# Patient Record
Sex: Female | Born: 1960
Health system: Southern US, Community
[De-identification: ages and names within clinical notes are randomized; demographics above are authoritative.]

## PROBLEM LIST (undated history)

## (undated) DIAGNOSIS — E119 Type 2 diabetes mellitus without complications: Secondary | ICD-10-CM

## (undated) DIAGNOSIS — E785 Hyperlipidemia, unspecified: Secondary | ICD-10-CM

## (undated) DIAGNOSIS — Z9109 Other allergy status, other than to drugs and biological substances: Secondary | ICD-10-CM

## (undated) DIAGNOSIS — I10 Essential (primary) hypertension: Secondary | ICD-10-CM

## (undated) DIAGNOSIS — D259 Leiomyoma of uterus, unspecified: Secondary | ICD-10-CM

## (undated) HISTORY — PX: CATARACT EXTRACTION: SUR2

## (undated) HISTORY — DX: Other allergy status, other than to drugs and biological substances: Z91.09

## (undated) HISTORY — PX: TONSILLECTOMY: SUR1361

## (undated) HISTORY — PX: TUBAL LIGATION: SHX77

---
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
## 2004-09-01 ENCOUNTER — Emergency Department: Payer: Self-pay | Admitting: Unknown Physician Specialty

## 2005-04-22 ENCOUNTER — Emergency Department: Payer: Self-pay | Admitting: Emergency Medicine

## 2005-04-22 ENCOUNTER — Other Ambulatory Visit: Payer: Self-pay

## 2006-06-02 ENCOUNTER — Emergency Department: Payer: Self-pay | Admitting: Emergency Medicine

## 2006-06-13 ENCOUNTER — Emergency Department: Payer: Self-pay | Admitting: Emergency Medicine

## 2006-12-13 ENCOUNTER — Emergency Department: Payer: Self-pay | Admitting: Emergency Medicine

## 2008-03-23 ENCOUNTER — Emergency Department: Payer: Self-pay | Admitting: Emergency Medicine

## 2008-06-11 ENCOUNTER — Ambulatory Visit: Payer: Self-pay | Admitting: Family Medicine

## 2009-09-26 ENCOUNTER — Emergency Department: Payer: Self-pay | Admitting: Emergency Medicine

## 2011-09-15 ENCOUNTER — Emergency Department: Payer: Self-pay | Admitting: *Deleted

## 2011-09-15 LAB — COMPREHENSIVE METABOLIC PANEL
Alkaline Phosphatase: 73 U/L (ref 50–136)
Anion Gap: 7 (ref 7–16)
BUN: 12 mg/dL (ref 7–18)
Bilirubin,Total: 0.2 mg/dL (ref 0.2–1.0)
Calcium, Total: 9 mg/dL (ref 8.5–10.1)
Co2: 25 mmol/L (ref 21–32)
Creatinine: 0.77 mg/dL (ref 0.60–1.30)
Glucose: 126 mg/dL — ABNORMAL HIGH (ref 65–99)
Osmolality: 279 (ref 275–301)
SGPT (ALT): 29 U/L
Sodium: 139 mmol/L (ref 136–145)
Total Protein: 8.3 g/dL — ABNORMAL HIGH (ref 6.4–8.2)

## 2011-09-15 LAB — CBC
HCT: 41.7 %
HGB: 13.7 g/dL
MCH: 28.5 pg
MCHC: 32.8 g/dL
MCV: 87 fL
Platelet: 240 x10 3/mm 3
RBC: 4.79 X10 6/mm 3
RDW: 13.7 %
WBC: 6.1 x10 3/mm 3

## 2011-09-15 LAB — TROPONIN I: Troponin-I: <0.02 ng/mL

## 2011-10-20 ENCOUNTER — Ambulatory Visit: Payer: Self-pay | Admitting: Family Medicine

## 2011-11-08 ENCOUNTER — Ambulatory Visit: Payer: Self-pay | Admitting: Family Medicine

## 2011-11-10 ENCOUNTER — Ambulatory Visit: Payer: Self-pay | Admitting: Family Medicine

## 2011-11-17 ENCOUNTER — Ambulatory Visit: Payer: Self-pay | Admitting: Family Medicine

## 2012-07-19 ENCOUNTER — Ambulatory Visit: Payer: Self-pay

## 2012-09-27 ENCOUNTER — Ambulatory Visit: Payer: Self-pay | Admitting: Ophthalmology

## 2012-09-27 LAB — HEMOGLOBIN: HGB: 13.8 g/dL (ref 12.0–16.0)

## 2012-10-08 ENCOUNTER — Ambulatory Visit: Payer: Self-pay | Admitting: Ophthalmology

## 2012-11-23 ENCOUNTER — Ambulatory Visit: Payer: Self-pay | Admitting: Ophthalmology

## 2013-01-17 ENCOUNTER — Ambulatory Visit: Payer: Self-pay | Admitting: Ophthalmology

## 2013-01-17 LAB — HEMOGLOBIN: HGB: 14.1 g/dL (ref 12.0–16.0)

## 2013-01-28 ENCOUNTER — Ambulatory Visit: Payer: Self-pay | Admitting: Ophthalmology

## 2013-02-13 ENCOUNTER — Ambulatory Visit: Payer: Self-pay | Admitting: Family Medicine

## 2013-12-09 ENCOUNTER — Ambulatory Visit: Payer: Self-pay | Admitting: Internal Medicine

## 2014-08-05 DIAGNOSIS — Z8619 Personal history of other infectious and parasitic diseases: Secondary | ICD-10-CM | POA: Insufficient documentation

## 2014-08-05 HISTORY — DX: Personal history of other infectious and parasitic diseases: Z86.19

## 2014-08-21 DIAGNOSIS — J302 Other seasonal allergic rhinitis: Secondary | ICD-10-CM | POA: Insufficient documentation

## 2014-08-22 NOTE — Op Note (Signed)
PATIENT NAME:  Patricia Sherman, TWADDELL MR#:  [id number] DATE OF BIRTH:  [birth year]  DATE OF PROCEDURE:  10/08/2012  PREOPERATIVE DIAGNOSIS: Cataract, left eye.   POSTOPERATIVE DIAGNOSIS: Cataract, left eye.   PROCEDURE PERFORMED: Extracapsular cataract extraction using phacoemulsification with placement of Alcon SN6CWS, 16.0 diopter posterior chamber lens, serial number [id number].   SURGEON: Loura Back. Dazani Norby, M.D.   ANESTHESIA: 4% lidocaine and 0.75% Marcaine 50-50 mixture with 10 units/mL of Hylenex added, given as a peribulbar.   ANESTHESIOLOGIST: Dr. Benjamine Mola.   COMPLICATIONS: None.   ESTIMATED BLOOD LOSS: Less than 1 mL.   DESCRIPTION OF PROCEDURE:  The patient was brought to the operating room and given a peribulbar block.  The patient was then prepped and draped in the usual fashion.  The vertical rectus muscles were imbricated using 5-0 silk sutures.  These sutures were then clamped to the sterile drapes as bridle sutures.  A limbal peritomy was performed extending two clock hours and hemostasis was obtained with cautery.  A partial thickness scleral groove was made at the surgical limbus and dissected anteriorly in a lamellar dissection using an Alcon crescent knife.  The anterior chamber was entered supero-temporally with a Superblade and through the lamellar dissection with a 2.6 mm keratome.  DisCoVisc was used to replace the aqueous and a continuous tear capsulorrhexis was carried out.  Hydrodissection and hydrodelineation were carried out with balanced salt and a 27 gauge canula.  The nucleus was rotated to confirm the effectiveness of the hydrodissection.  Phacoemulsification was carried out using a divide-and-conquer technique.  Total ultrasound time was 1 minute and 17 seconds with an average power of 25.5%. CDE 34.42.  Irrigation/aspiration was used to remove the residual cortex.  DisCoVisc was used to inflate the capsule and the internal incision was enlarged to 3 mm with the  crescent knife.  The intraocular lens was folded and inserted into the capsular bag using the AcrySert delivery system.  Irrigation/aspiration was used to remove the residual DisCoVisc.  Miostat was injected into the anterior chamber through the paracentesis track to inflate the anterior chamber and induce miosis.  Cefuroxime 0.1 mL was injected through the paracentesis track. The wound was checked for leaks and none were found. The conjunctiva was closed with cautery and the bridle sutures were removed.  Two drops of 0.3% Vigamox were placed on the eye.   An eye shield was placed on the eye.  The patient was discharged to the recovery room in good condition.   ____________________________ Loura Back Jeania Nater, MD sad:jm D: 10/08/2012 14:00:34 ET T: 10/08/2012 15:05:07 ET JOB#: [id number]  cc: Remo Lipps A. Jadwiga Faidley, MD, <Dictator> Martie Lee MD ELECTRONICALLY SIGNED 10/15/2012 13:33

## 2014-08-22 NOTE — Op Note (Signed)
PATIENT NAME:  Patricia Sherman, Patricia Sherman MR#:  [id number] DATE OF BIRTH:  09/03/1960  DATE OF SURGERY:  01/28/2013  PREOPERATIVE DIAGNOSIS: Cataract, right eye.   POSTOPERATIVE DIAGNOSIS:  Cataract, right eye.   PROCEDURE PERFORMED: Extracapsular cataract extraction using phacoemulsification with placement of an Alcon SN6CWS 16.0-diopter posterior chamber lens, serial [id number].   SURGEON: Loura Back. Jhon Mallozzi, M.D.   ANESTHESIA: 4% lidocaine and 0.75% Marcaine, a 50/50 mixture with 10 units/mL of Hylenex added given as a peribulbar.   ANESTHESIOLOGIST: Dr. Myra Gianotti.   COMPLICATIONS: None.   ESTIMATED BLOOD LOSS: Less than 1 mL.   DESCRIPTION OF PROCEDURE:  The patient was brought to the operating room and given a peribulbar block.  The patient was then prepped and draped in the usual fashion.  The vertical rectus muscles were imbricated using 5-0 silk sutures.  These sutures were then clamped to the sterile drapes as bridle sutures.  A limbal peritomy was performed extending two clock hours and hemostasis was obtained with cautery.  A partial thickness scleral groove was made at the surgical limbus and then dissected anteriorly in a lamellar dissection with using an Alcon crescent knife.  The anterior chamber was entered superonasally with a Superblade and through the lamellar dissection with a 2.6-mm keratome.  DisCoVisc was used to replace the aqueous and a continuous tear capsulorrhexis was carried out.  Hydrodissection and hydrodelineation were carried out with balanced salt and a 27 gauge canula.  The nucleus was rotated to confirm the effectiveness of the hydrodissection.  Phacoemulsification was carried out using a divide-and-conquer technique.  Total ultrasound time was 1 minutes and 22 seconds, with an average power of 23.8%, CDE 31.68.  Irrigation/aspiration was used to remove the residual cortex.  DisCoVisc was used to inflate the capsule and the internal wound was enlarged to 3 mm with  the crescent knife.  The intraocular lens was inserted into the capsular bag using the [employer].  Irrigation/aspiration was used to remove the residual DisCoVisc.  Miostat was injected into the anterior chamber through the paracentesis track to inflate the anterior chamber and induce miosis.  No suture was placed. An AcrySert delivery system was used; 0.1 mL of cefuroxime containing 1 mg of the drug was injected via the paracentesis tract to induce miosis.   The wound was checked for leaks and wound leakage was found.  A single 10-0 suture was placed across the incision, tied and the knot was rotated superiorly.  The conjunctiva was closed with cautery and the bridle sutures were removed.  Two drops of 0.3% Vigamox were placed on the eye.  An eye shield was placed on the eye.  The patient was discharged to the recovery room in good condition.    ____________________________ Loura Back Anabela Crayton, MD sad:dm D: 01/28/2013 13:15:17 ET T: 01/28/2013 14:28:18 ET JOB#: [id number]  cc: Remo Lipps A. Basilio Meadow, MD, <Dictator> Martie Lee MD ELECTRONICALLY SIGNED 02/04/2013 13:24

## 2015-06-09 ENCOUNTER — Encounter: Payer: Self-pay | Admitting: Physician Assistant

## 2015-06-09 ENCOUNTER — Ambulatory Visit: Payer: Self-pay | Admitting: Physician Assistant

## 2015-06-09 VITALS — BP 138/90 | HR 76 | Temp 97.7°F

## 2015-06-09 DIAGNOSIS — R300 Dysuria: Secondary | ICD-10-CM

## 2015-06-09 LAB — POCT URINALYSIS DIPSTICK
Bilirubin, UA: NEGATIVE
Glucose, UA: NEGATIVE
Ketones, UA: NEGATIVE
NITRITE UA: NEGATIVE
Protein, UA: NEGATIVE
RBC UA: NEGATIVE
SPEC GRAV UA: 1.025
UROBILINOGEN UA: 0.2
pH, UA: 5

## 2015-06-09 MED ORDER — CEFDINIR 300 MG PO CAPS
300.0000 mg | ORAL_CAPSULE | Freq: Two times a day (BID) | ORAL | Status: DC
Start: 2015-06-09 — End: 2016-02-09

## 2015-06-09 MED ORDER — FLUCONAZOLE 150 MG PO TABS
ORAL_TABLET | ORAL | Status: DC
Start: 2015-06-09 — End: 2016-02-09

## 2015-06-09 NOTE — Progress Notes (Signed)
S: C/o runny nose and congestion for 3 days, facial pain across forehead and cheeks, nose bleeds x 2, stopped after pinching nose for about a minute;  no fever, chills, cp/sob, v/d; mucus is green and thick,    O: PE: vitals wnl, nad, appears well, perrl eomi, normocephalic, face tender to palp at frontal and max sinus; tms dull, nasal mucosa red and swollen, throat injected, neck supple no lymph, lungs c t a, cv rrr, neuro intact,ua trace leuks  A:  Acute sinusitis, uti   P: omnicef 300mg  bid, diflucan, neosproin or vaseline to nares; drink fluids, continue regular meds , use otc meds of choice, return if not improving in 5 days, return earlier if worsening

## 2015-06-19 ENCOUNTER — Ambulatory Visit: Payer: Self-pay | Admitting: Physician Assistant

## 2015-07-01 ENCOUNTER — Encounter: Payer: Self-pay | Admitting: Physician Assistant

## 2015-07-01 ENCOUNTER — Ambulatory Visit: Payer: Self-pay | Admitting: Family

## 2015-07-01 VITALS — BP 110/84 | Temp 98.7°F

## 2015-07-01 DIAGNOSIS — J019 Acute sinusitis, unspecified: Secondary | ICD-10-CM

## 2015-07-01 NOTE — Progress Notes (Signed)
See note in paper chart by Heather Ratcliffe, PAC  

## 2015-07-13 DIAGNOSIS — M7662 Achilles tendinitis, left leg: Secondary | ICD-10-CM | POA: Diagnosis not present

## 2015-07-13 DIAGNOSIS — M7661 Achilles tendinitis, right leg: Secondary | ICD-10-CM | POA: Diagnosis not present

## 2015-07-22 ENCOUNTER — Encounter: Payer: Self-pay | Admitting: Physician Assistant

## 2015-07-22 ENCOUNTER — Ambulatory Visit: Payer: Self-pay | Admitting: Physician Assistant

## 2015-07-22 VITALS — BP 140/87 | Temp 98.0°F

## 2015-07-22 DIAGNOSIS — N390 Urinary tract infection, site not specified: Secondary | ICD-10-CM

## 2015-07-22 DIAGNOSIS — R509 Fever, unspecified: Secondary | ICD-10-CM

## 2015-07-22 DIAGNOSIS — R319 Hematuria, unspecified: Secondary | ICD-10-CM

## 2015-07-22 DIAGNOSIS — R300 Dysuria: Secondary | ICD-10-CM

## 2015-07-22 LAB — POCT URINALYSIS DIPSTICK
BILIRUBIN UA: NEGATIVE
GLUCOSE UA: NEGATIVE
KETONES UA: NEGATIVE
NITRITE UA: NEGATIVE
Protein, UA: NEGATIVE
Spec Grav, UA: >=1.03
Urobilinogen, UA: 0.2
pH, UA: 6

## 2015-07-22 LAB — POCT INFLUENZA A/B
INFLUENZA B, POC: NEGATIVE
Influenza A, POC: NEGATIVE

## 2015-07-22 MED ORDER — CIPROFLOXACIN HCL 250 MG PO TABS: 250.0000 mg | ORAL_TABLET | Freq: Two times a day (BID) | ORAL | Status: AC

## 2015-07-22 NOTE — Progress Notes (Signed)
S: C/o runny nose and congestion for 3 days, sore throat and headache, sweating a lot but no fever, chills, cp/sob, v/d; also having a little suprapubic pain so thinks she has a uti  Using otc meds:   O: PE: vitals wnl, nad, perrl eomi, normocephalic, tms dull, nasal mucosa red and swollen, throat injected, neck supple no lymph, lungs c t a, cv rrr, neuro intact flu swab neg, ua 1+ leuks, trace blood  A:  Acute uti, pnd   P: cipro 250mg  bid x 7d; drink fluids, continue regular meds , use otc meds of choice, return if not improving in 5 days, return earlier if worsening

## 2015-08-28 ENCOUNTER — Encounter: Payer: Self-pay | Admitting: Physician Assistant

## 2015-08-28 ENCOUNTER — Ambulatory Visit: Payer: Self-pay | Admitting: Physician Assistant

## 2015-08-28 DIAGNOSIS — R300 Dysuria: Secondary | ICD-10-CM

## 2015-08-28 DIAGNOSIS — B349 Viral infection, unspecified: Secondary | ICD-10-CM

## 2015-08-28 LAB — POCT URINALYSIS DIPSTICK
BILIRUBIN UA: NEGATIVE
Glucose, UA: NEGATIVE
KETONES UA: NEGATIVE
Nitrite, UA: NEGATIVE
PH UA: 6
Protein, UA: NEGATIVE
RBC UA: NEGATIVE
Spec Grav, UA: 1.025
Urobilinogen, UA: 0.2

## 2015-08-28 NOTE — Progress Notes (Signed)
S: c/o just not feeling well, had diarrhea earlier this week, stomach still hurts a little, had fever/chills, no body aches, still getting sweaty every now and then, no v/, no uti sx, some nasal congestion; denies cp/sob/elevated glucose  O: vitals wnl, tms clear, nasal mucosa pink swollen, throat wnl, neck supple no lymph, lungs c t a, cv rrr, abd soft nontender, ua wnl  A: viral illness  P: increase fluids, recheck next week if not improving, go to ER if worsening over the weekend

## 2015-10-06 DIAGNOSIS — J302 Other seasonal allergic rhinitis: Secondary | ICD-10-CM | POA: Diagnosis not present

## 2015-10-06 DIAGNOSIS — E119 Type 2 diabetes mellitus without complications: Secondary | ICD-10-CM | POA: Diagnosis not present

## 2015-10-06 DIAGNOSIS — I10 Essential (primary) hypertension: Secondary | ICD-10-CM | POA: Diagnosis not present

## 2015-10-13 ENCOUNTER — Other Ambulatory Visit: Payer: Self-pay | Admitting: Internal Medicine

## 2015-10-13 DIAGNOSIS — J302 Other seasonal allergic rhinitis: Secondary | ICD-10-CM | POA: Diagnosis not present

## 2015-10-13 DIAGNOSIS — Z1231 Encounter for screening mammogram for malignant neoplasm of breast: Secondary | ICD-10-CM

## 2015-10-13 DIAGNOSIS — R5383 Other fatigue: Secondary | ICD-10-CM | POA: Diagnosis not present

## 2015-10-13 DIAGNOSIS — E119 Type 2 diabetes mellitus without complications: Secondary | ICD-10-CM | POA: Diagnosis not present

## 2015-10-20 ENCOUNTER — Ambulatory Visit: Payer: Self-pay | Attending: Internal Medicine

## 2015-10-29 DIAGNOSIS — R5382 Chronic fatigue, unspecified: Secondary | ICD-10-CM | POA: Diagnosis not present

## 2015-10-29 DIAGNOSIS — L739 Follicular disorder, unspecified: Secondary | ICD-10-CM | POA: Diagnosis not present

## 2015-10-29 DIAGNOSIS — R0789 Other chest pain: Secondary | ICD-10-CM | POA: Diagnosis not present

## 2015-10-29 DIAGNOSIS — R531 Weakness: Secondary | ICD-10-CM | POA: Diagnosis not present

## 2015-11-16 DIAGNOSIS — E669 Obesity, unspecified: Secondary | ICD-10-CM | POA: Diagnosis not present

## 2015-11-16 DIAGNOSIS — I208 Other forms of angina pectoris: Secondary | ICD-10-CM | POA: Diagnosis not present

## 2015-11-16 DIAGNOSIS — R0602 Shortness of breath: Secondary | ICD-10-CM | POA: Diagnosis not present

## 2015-11-16 DIAGNOSIS — E119 Type 2 diabetes mellitus without complications: Secondary | ICD-10-CM | POA: Diagnosis not present

## 2015-11-17 ENCOUNTER — Other Ambulatory Visit: Payer: Self-pay | Admitting: Internal Medicine

## 2015-11-17 DIAGNOSIS — R0602 Shortness of breath: Secondary | ICD-10-CM

## 2015-11-17 DIAGNOSIS — I2089 Other forms of angina pectoris: Secondary | ICD-10-CM

## 2015-11-17 DIAGNOSIS — I208 Other forms of angina pectoris: Secondary | ICD-10-CM

## 2015-11-25 ENCOUNTER — Emergency Department: Payer: 59

## 2015-11-25 ENCOUNTER — Encounter: Payer: Self-pay | Admitting: Emergency Medicine

## 2015-11-25 ENCOUNTER — Emergency Department
Admission: EM | Admit: 2015-11-25 | Discharge: 2015-11-25 | Disposition: A | Discharge status: Home / Self Care | Payer: 59 | Attending: Emergency Medicine | Admitting: Emergency Medicine

## 2015-11-25 DIAGNOSIS — R079 Chest pain, unspecified: Secondary | ICD-10-CM | POA: Diagnosis not present

## 2015-11-25 DIAGNOSIS — R0789 Other chest pain: Secondary | ICD-10-CM | POA: Diagnosis not present

## 2015-11-25 DIAGNOSIS — E119 Type 2 diabetes mellitus without complications: Secondary | ICD-10-CM | POA: Insufficient documentation

## 2015-11-25 DIAGNOSIS — M62838 Other muscle spasm: Secondary | ICD-10-CM

## 2015-11-25 DIAGNOSIS — Z79899 Other long term (current) drug therapy: Secondary | ICD-10-CM | POA: Insufficient documentation

## 2015-11-25 DIAGNOSIS — I10 Essential (primary) hypertension: Secondary | ICD-10-CM | POA: Diagnosis not present

## 2015-11-25 DIAGNOSIS — Z7984 Long term (current) use of oral hypoglycemic drugs: Secondary | ICD-10-CM | POA: Insufficient documentation

## 2015-11-25 HISTORY — DX: Essential (primary) hypertension: I10

## 2015-11-25 HISTORY — DX: Type 2 diabetes mellitus without complications: E11.9

## 2015-11-25 LAB — BASIC METABOLIC PANEL
ANION GAP: 7 (ref 5–15)
BUN: 15 mg/dL (ref 6–20)
CALCIUM: 9.4 mg/dL (ref 8.9–10.3)
CO2: 25 mmol/L (ref 22–32)
Chloride: 105 mmol/L (ref 101–111)
Creatinine, Ser: 0.76 mg/dL (ref 0.44–1.00)
Glucose, Bld: 157 mg/dL — ABNORMAL HIGH (ref 65–99)
POTASSIUM: 4.2 mmol/L (ref 3.5–5.1)
SODIUM: 137 mmol/L (ref 135–145)

## 2015-11-25 LAB — CBC
HEMATOCRIT: 42.9 % (ref 35.0–47.0)
HEMOGLOBIN: 14.5 g/dL (ref 12.0–16.0)
MCH: 28.4 pg (ref 26.0–34.0)
MCHC: 33.8 g/dL (ref 32.0–36.0)
MCV: 84 fL (ref 80.0–100.0)
Platelets: 237 10*3/uL (ref 150–440)
RBC: 5.11 MIL/uL (ref 3.80–5.20)
RDW: 13.3 % (ref 11.5–14.5)
WBC: 7.7 10*3/uL (ref 3.6–11.0)

## 2015-11-25 LAB — TROPONIN I

## 2015-11-25 MED ORDER — IBUPROFEN 800 MG PO TABS: 800.0000 mg | ORAL_TABLET | Freq: Three times a day (TID) | ORAL | 0 refills | Status: DC | PRN

## 2015-11-25 MED ORDER — CYCLOBENZAPRINE HCL 10 MG PO TABS: 10.0000 mg | ORAL_TABLET | Freq: Three times a day (TID) | ORAL | 0 refills | Status: DC | PRN

## 2015-11-25 MED ORDER — CYCLOBENZAPRINE HCL 10 MG PO TABS
10.0000 mg | ORAL_TABLET | Freq: Once | ORAL | Status: AC
  Administered 2015-11-25: 10 mg via ORAL
  Filled 2015-11-25: qty 1

## 2015-11-25 MED ORDER — IBUPROFEN 800 MG PO TABS
800.0000 mg | ORAL_TABLET | Freq: Once | ORAL | Status: AC
  Administered 2015-11-25: 800 mg via ORAL
  Filled 2015-11-25: qty 1

## 2015-11-25 NOTE — ED Notes (Signed)
Pt discharged to home.  Family member driving.  Discharge instructions reviewed.  Verbalized understanding.  No questions or concerns at this time.  Teach back verified.  Pt in NAD.  No items left in ED.   

## 2015-11-25 NOTE — ED Provider Notes (Signed)
[facility] Emergency Department Provider Note ____________________________________________  Time seen:   I have reviewed the triage vital signs and the triage nursing note.  HISTORY  Chief Complaint Chest Pain   Historian Patient  HPI Patricia Sherman is a 55 y.o. female here for evaluation of right neck/right shoulder and right breast and right back pain. Symptoms have been ongoing at least a week or so and are intermittent. She started seen her primary care physician and has an appointment with a cardiologist, Dr. Clayborn Bigness on Friday for likely stress testing.  For work she does lifttrays up and down although does not report a significant specific injury.  Symptoms are worse when she lifts and she feels it on the anterior aspect of her shoulder.  No trouble breathing, nausea, vomiting, sweats, weakness, confusion or altered mental status.    Past Medical History:  Diagnosis Date  . Diabetes mellitus without complication ([place])   . Hypertension     There are no active problems to display for this patient.   Past Surgical History:  Procedure Laterality Date  . TONSILLECTOMY    . TUBAL LIGATION      Prior to Admission medications   Medication Sig Start Date End Date Taking? Authorizing Provider  cefdinir (OMNICEF) 300 MG capsule Take 1 capsule (300 mg total) by mouth 2 (two) times daily. Patient not taking: Reported on 07/01/2015 06/09/15   Versie Starks, PA-C  cyclobenzaprine (FLEXERIL) 10 MG tablet Take 1 tablet (10 mg total) by mouth 3 (three) times daily as needed for muscle spasms. 11/25/15   Lisa Roca, MD  ferrous sulfate 325 (65 FE) MG tablet Take by mouth.    Historical Provider, MD  fluconazole (DIFLUCAN) 150 MG tablet Take one now and one in a week Patient not taking: Reported on 07/01/2015 06/09/15   Versie Starks, PA-C  glimepiride Jari Sportsman) 2 MG tablet  05/11/15   Historical Provider, MD  ibuprofen (ADVIL,MOTRIN) 800 MG tablet Take 1 tablet  (800 mg total) by mouth every 8 (eight) hours as needed. 11/25/15   Lisa Roca, MD  JANUVIA 100 MG tablet  05/11/15   Historical Provider, MD  meloxicam (MOBIC) 15 MG tablet Reported on 07/01/2015 03/12/15   Historical Provider, MD  valsartan (DIOVAN) 80 MG tablet  05/11/15   Historical Provider, MD    Allergies  Allergen Reactions  . Metformin Diarrhea    Other reaction(s): Other (See Comments) Elevated heart rate  . Sulfa Antibiotics Rash    No family history on file.  Social History Social History  Substance Use Topics  . Smoking status: Never Smoker  . Smokeless tobacco: Never Used  . Alcohol use No    Review of Systems  Constitutional: Negative for fever. Eyes: Negative for visual changes. ENT: Negative for sore throat. Cardiovascular: Negative for palpitations. Respiratory: Negative for shortness of breath. Gastrointestinal: Negative for abdominal pain, vomiting and diarrhea. Genitourinary: Negative for dysuria. Musculoskeletal: Negative for back pain. Skin: Negative for rash. Neurological: Negative for headache. 10 point Review of Systems otherwise negative ____________________________________________   PHYSICAL EXAM:  VITAL SIGNS: ED Triage Vitals  Enc Vitals Group     BP 11/25/15 1547 (!) 161/79     Pulse Rate 11/25/15 1547 92     Resp 11/25/15 1547 18     Temp 11/25/15 1547 97.5 F (36.4 C)     Temp Source 11/25/15 1547 Oral     SpO2 11/25/15 1547 96 %     Weight 11/25/15  1547 252 lb (114.3 kg)     Height 11/25/15 1547 5\' 1"  (1.549 m)     Head Circumference --      Peak Flow --      Pain Score 11/25/15 1548 7     Pain Loc --      Pain Edu? --      Excl. in [place]? --      Constitutional: Alert and oriented. Well appearing and in no distress. HEENT   Head: Normocephalic and atraumatic.      Eyes: Conjunctivae are normal. PERRL. Normal extraocular movements.      Ears:         Nose: No congestion/rhinnorhea.   Mouth/Throat: Mucous membranes  are moist.   Neck: No stridor.  Palpable muscle spasm along shoulder and neck area of trapezius which is tender to palpation. Cardiovascular/Chest: Normal rate, regular rhythm.  No murmurs, rubs, or gallops. Respiratory: Normal respiratory effort without tachypnea nor retractions. Breath sounds are clear and equal bilaterally. No wheezes/rales/rhonchi. Gastrointestinal: Soft. No distention, no guarding, no rebound. Nontender.   Genitourinary/rectal:Deferred Musculoskeletal: Right anterior shoulder pain along joint margin/muscle insertion sites. No joint effusions.  No lower extremity tenderness.  No edema. Neurologic:  Normal speech and language. No gross or focal neurologic deficits are appreciated. Skin:  Skin is warm, dry and intact. No rash noted. Psychiatric: Mood and affect are normal. Speech and behavior are normal. Patient exhibits appropriate insight and judgment.  ____________________________________________   EKG I, Lisa Roca, MD, the attending physician have personally viewed and interpreted all ECGs.  86 bpm. Normal sinus rhythm. Narrow QRS. Normal axis. Normal ST and T-wave. Q waves septally. ____________________________________________  LABS (pertinent positives/negatives)  Labs Reviewed  BASIC METABOLIC PANEL - Abnormal; Notable for the following:       Result Value   Glucose, Bld 157 (*)    All other components within normal limits  CBC  TROPONIN I    ____________________________________________  RADIOLOGY All Xrays were viewed by me. Imaging interpreted by Radiologist.  Chest two-view: Hyperinflation without acute cardiopulmonary disease. __________________________________________  PROCEDURES  Procedure(s) performed: None  Critical Care performed: None  ____________________________________________   ED COURSE / ASSESSMENT AND PLAN  Pertinent labs & imaging results that were available during my care of the patient were reviewed by me and  considered in my medical decision making (see chart for details).   Initially when she described her symptoms did seem somewhat musculoskeletal in nature in terms of it being worse with movements, and ranging from the right neck to the right arm to the right chest and sometimes into the back. There was no GI symptoms. Low suspicion for ACS and EKG and laboratory studies reassuring.  On physical exam though she has a very palpable muscle spasm in the right trapezius which I think is responsible for her discomfort.  I still think based on her age and history of atypical chest discomfort she should follow up with the cardiologist on Friday. But for now, I am prescribing ibuprofen and Flexeril and massage and stretching and if this does not alleviate her symptoms, she may need physical therapy for further management musculoskeletal pain.    CONSULTATIONS:   None   Patient / Family / Caregiver informed of clinical course, medical decision-making process, and agree with plan.   I discussed return precautions, follow-up instructions, and discharged instructions with patient and/or family.   ___________________________________________   FINAL CLINICAL IMPRESSION(S) / ED DIAGNOSES   Final diagnoses:  Nonspecific chest  pain  Trapezius muscle spasm              Note: This dictation was prepared with Dragon dictation. Any transcriptional errors that result from this process are unintentional    Lisa Roca, MD 11/25/15 1906

## 2015-11-25 NOTE — ED Triage Notes (Signed)
Pt presents to ED with reports of chest pain that began last night. Pt reports pain radiates to right arm and neck. Pt reports pain under breasts bilaterally. Pt denies SOB, nausea, vomiting or dizziness.

## 2015-11-25 NOTE — Discharge Instructions (Signed)
Although no certain cause was found for your symptoms, your exam and evaluation are reassuring in the Emergency Department today.  As we discussed, I suspect your symptoms are coming from muscle spasm. You may use stretching and heat and massage/pressure to help alleviate the muscle spasm. You may also discuss with her primary care physician about possible physical therapy or massage to help with the pain.  Certainly, continue your follow-up appointment on Friday with your cardiologist. Return to the Emergency Department for any new or worsening symptoms including trouble breathing, shortness of breath, palpitations, worsening pain, weakness or numbness, confusion or altered mental status, or any other symptoms concerning to you.

## 2015-11-26 ENCOUNTER — Ambulatory Visit: Payer: Self-pay

## 2015-11-27 ENCOUNTER — Ambulatory Visit
Admission: RE | Admit: 2015-11-27 | Discharge: 2015-11-27 | Disposition: A | Discharge status: Home / Self Care | Payer: 59 | Source: Ambulatory Visit | Attending: Internal Medicine | Admitting: Internal Medicine

## 2015-11-27 DIAGNOSIS — I208 Other forms of angina pectoris: Secondary | ICD-10-CM | POA: Insufficient documentation

## 2015-11-27 DIAGNOSIS — I209 Angina pectoris, unspecified: Secondary | ICD-10-CM | POA: Diagnosis not present

## 2015-11-27 DIAGNOSIS — R0602 Shortness of breath: Secondary | ICD-10-CM | POA: Diagnosis not present

## 2015-11-27 DIAGNOSIS — R079 Chest pain, unspecified: Secondary | ICD-10-CM | POA: Diagnosis not present

## 2015-11-27 MED ORDER — TECHNETIUM TC 99M TETROFOSMIN IV KIT
30.0000 | PACK | Freq: Once | INTRAVENOUS | Status: AC | PRN
  Administered 2015-11-27: 31.83 via INTRAVENOUS

## 2015-11-27 MED ORDER — REGADENOSON 0.4 MG/5ML IV SOLN
0.4000 mg | Freq: Once | INTRAVENOUS | Status: AC
  Administered 2015-11-27: 0.4 mg via INTRAVENOUS

## 2015-11-27 MED ORDER — TECHNETIUM TC 99M TETROFOSMIN IV KIT
12.9500 | PACK | Freq: Once | INTRAVENOUS | Status: AC | PRN
  Administered 2015-11-27: 12.95 via INTRAVENOUS

## 2015-11-27 NOTE — Progress Notes (Signed)
*  PRELIMINARY RESULTS* Echocardiogram 2D Echocardiogram has been performed.  Sherrie Sport 11/27/2015, 11:48 AM

## 2015-12-01 LAB — NM MYOCAR MULTI W/SPECT W/WALL MOTION / EF
CHL CUP NUCLEAR SDS: 16
CHL CUP RESTING HR STRESS: 106 {beats}/min
CHL CUP STRESS STAGE 2 GRADE: 0 %
CHL CUP STRESS STAGE 2 HR: 107 {beats}/min
CHL CUP STRESS STAGE 2 SPEED: 0 mph
CHL CUP STRESS STAGE 3 GRADE: 0 %
CHL CUP STRESS STAGE 5 GRADE: 0 %
CHL CUP STRESS STAGE 5 SPEED: 0 mph
CHL CUP STRESS STAGE 6 SBP: 145 mmHg
CHL CUP STRESS STAGE 6 SPEED: 0 mph
CSEPED: 1 min
CSEPEDS: 19 s
CSEPEW: 1 METS
CSEPPHR: 136 {beats}/min
CSEPPMHR: 82 %
LVDIAVOL: 95 mL (ref 46–106)
LVSYSVOL: 28 mL
NUC STRESS TID: 0.98
SRS: 5
SSS: 19
Stage 1 HR: 106 {beats}/min
Stage 3 HR: 107 {beats}/min
Stage 3 Speed: 0 mph
Stage 4 Grade: 0 %
Stage 4 HR: 136 {beats}/min
Stage 4 Speed: 0 mph
Stage 5 HR: 122 {beats}/min
Stage 6 DBP: 71 mmHg
Stage 6 Grade: 0 %
Stage 6 HR: 109 {beats}/min

## 2015-12-04 ENCOUNTER — Other Ambulatory Visit: Payer: Self-pay | Admitting: Internal Medicine

## 2015-12-04 ENCOUNTER — Ambulatory Visit
Admission: RE | Admit: 2015-12-04 | Discharge: 2015-12-04 | Disposition: A | Discharge status: Home / Self Care | Payer: 59 | Source: Ambulatory Visit | Attending: Internal Medicine | Admitting: Internal Medicine

## 2015-12-04 DIAGNOSIS — Z1231 Encounter for screening mammogram for malignant neoplasm of breast: Secondary | ICD-10-CM | POA: Insufficient documentation

## 2015-12-22 ENCOUNTER — Ambulatory Visit: Payer: Self-pay | Admitting: Physician Assistant

## 2016-01-01 ENCOUNTER — Ambulatory Visit: Payer: Self-pay | Admitting: Physician Assistant

## 2016-01-01 ENCOUNTER — Encounter: Payer: Self-pay | Admitting: Physician Assistant

## 2016-01-01 VITALS — BP 120/90 | HR 68 | Temp 98.6°F

## 2016-01-01 DIAGNOSIS — K120 Recurrent oral aphthae: Secondary | ICD-10-CM

## 2016-01-01 NOTE — Progress Notes (Signed)
   Subjective:    Patient ID: Patricia Sherman, female    DOB: 05-27-1960, 55 y.o.   MRN: [id number]  HPI Patient c/o oral ulcer lesion on lower inner lip and tongue for 2 days. States mild to moderate pain. No palliative measure for compliant. Also request Diflucan for vaginal yeast  compliant.   Review of Systems Diabetes and HTN.    Objective:   Physical Exam Oval ulcer lesion as above.       Assessment & Plan:Aphthous ulcer  Magic mouthwash and Diflucan. Follow up as needed.

## 2016-01-06 DIAGNOSIS — E119 Type 2 diabetes mellitus without complications: Secondary | ICD-10-CM | POA: Diagnosis not present

## 2016-01-08 ENCOUNTER — Ambulatory Visit: Payer: Self-pay | Admitting: Physician Assistant

## 2016-01-08 ENCOUNTER — Encounter: Payer: Self-pay | Admitting: Physician Assistant

## 2016-01-08 VITALS — BP 120/90 | Temp 98.1°F

## 2016-01-08 DIAGNOSIS — N898 Other specified noninflammatory disorders of vagina: Secondary | ICD-10-CM

## 2016-01-08 DIAGNOSIS — R300 Dysuria: Secondary | ICD-10-CM

## 2016-01-08 LAB — POCT URINALYSIS DIPSTICK
BILIRUBIN UA: NEGATIVE
Blood, UA: NEGATIVE
GLUCOSE UA: NEGATIVE
KETONES UA: NEGATIVE
LEUKOCYTES UA: NEGATIVE
Nitrite, UA: NEGATIVE
PH UA: 7
Spec Grav, UA: 1.02
Urobilinogen, UA: NEGATIVE

## 2016-01-08 NOTE — Progress Notes (Signed)
S: c/o some vaginal itchy, just "doesn't feel right down there" some discharge, no odor, took a diflucan, felt a little better but itching persists  O: vitals wnl, nad, ua wnl  A: vaginal itching  P: f/u with pcp or gyn for pelvic exam

## 2016-02-09 ENCOUNTER — Encounter: Payer: Self-pay | Admitting: Obstetrics and Gynecology

## 2016-02-09 ENCOUNTER — Ambulatory Visit (INDEPENDENT_AMBULATORY_CARE_PROVIDER_SITE_OTHER): Payer: 59 | Admitting: Obstetrics and Gynecology

## 2016-02-09 VITALS — BP 126/82 | HR 84 | Ht 62.0 in | Wt 253.4 lb

## 2016-02-09 DIAGNOSIS — E119 Type 2 diabetes mellitus without complications: Secondary | ICD-10-CM | POA: Diagnosis not present

## 2016-02-09 DIAGNOSIS — N951 Menopausal and female climacteric states: Secondary | ICD-10-CM | POA: Diagnosis not present

## 2016-02-09 DIAGNOSIS — E66813 Obesity, class 3: Secondary | ICD-10-CM

## 2016-02-09 DIAGNOSIS — D259 Leiomyoma of uterus, unspecified: Secondary | ICD-10-CM

## 2016-02-09 DIAGNOSIS — B379 Candidiasis, unspecified: Secondary | ICD-10-CM

## 2016-02-09 DIAGNOSIS — Z780 Asymptomatic menopausal state: Secondary | ICD-10-CM

## 2016-02-09 DIAGNOSIS — R102 Pelvic and perineal pain: Secondary | ICD-10-CM

## 2016-02-09 DIAGNOSIS — Z5320 Procedure and treatment not carried out because of patient's decision for unspecified reasons: Secondary | ICD-10-CM | POA: Insufficient documentation

## 2016-02-09 HISTORY — DX: Leiomyoma of uterus, unspecified: D25.9

## 2016-02-09 MED ORDER — NYSTATIN-TRIAMCINOLONE 100000-0.1 UNIT/GM-% EX CREA: 1.0000 "application " | TOPICAL_CREAM | Freq: Two times a day (BID) | CUTANEOUS | 1 refills | Status: DC

## 2016-02-09 NOTE — Addendum Note (Signed)
Addended by: Elouise Munroe on: 02/09/2016 10:26 AM   Modules accepted: Orders

## 2016-02-09 NOTE — Patient Instructions (Addendum)
1. Ultrasound is ordered 2. Information from ultrasound will be given through my chart. If follow-up is necessary, an appointment will be made 3. Nystatin/triamcinolone cream is to be applied topically to the skin rash on abdomen twice a day for 10 days

## 2016-02-09 NOTE — Progress Notes (Signed)
GYN ENCOUNTER NOTE  Subjective:       Patricia Sherman is a 55 y.o. [phone] female is here for gynecologic evaluation of the following issues:  1. Left lower quadrant pain 2. History of fibroids  No recent gynecologic evaluation. Patient reports intermittent left lower quadrant pain, made worse with walking, response to Advil. No reported change in bowel or bladder function. Bowel movements are daily. Not currently sexually active. No history of abnormal Pap smears..     Gynecologic History No LMP recorded. Patient is postmenopausal. Contraception: post menopausal status Last Pap: Unknown Last mammogram: Unknown  Obstetric History OB History  Gravida Para Term Preterm AB Living  5 2 2   3 2   SAB TAB Ectopic Multiple Live Births    0     2    # Outcome Date GA Lbr Len/2nd Weight Sex Delivery Anes PTL Lv  5 Term 1992   7 lb 6.4 oz (3.357 kg) F Vag-Spont   LIV  4 Term 1985   6 lb 11.2 oz (3.039 kg) F Vag-Spont   LIV  3 AB           2 AB           1 AB               Past Medical History:  Diagnosis Date  . Diabetes mellitus without complication ([place])   . Environmental allergies   . Hypertension     Past Surgical History:  Procedure Laterality Date  . CATARACT EXTRACTION    . TONSILLECTOMY    . TUBAL LIGATION      Current Outpatient Prescriptions on File Prior to Visit  Medication Sig Dispense Refill  . glimepiride (AMARYL) 2 MG tablet   3  . ibuprofen (ADVIL,MOTRIN) 800 MG tablet Take 1 tablet (800 mg total) by mouth every 8 (eight) hours as needed. 30 tablet 0  . JANUVIA 100 MG tablet   5  . valsartan (DIOVAN) 80 MG tablet   5   No current facility-administered medications on file prior to visit.     Allergies  Allergen Reactions  . Metformin Diarrhea    Other reaction(s): Other (See Comments) Elevated heart rate  . Sulfa Antibiotics Rash    Social History   Social History  . Marital status: Single    Spouse name: N/A  . Number of children: N/A  .  Years of education: N/A   Occupational History  . Not on file.   Social History Main Topics  . Smoking status: Never Smoker  . Smokeless tobacco: Never Used  . Alcohol use No  . Drug use: No  . Sexual activity: Not Currently   Other Topics Concern  . Not on file   Social History Narrative  . No narrative on file    Family History  Problem Relation Age of Onset  . Heart disease Mother   . Diabetes Sister   . Heart disease Brother   . Diabetes Daughter   . Breast cancer Neg Hx   . Ovarian cancer Neg Hx   . Colon cancer Neg Hx     The following portions of the patient's history were reviewed and updated as appropriate: allergies, current medications, past family history, past medical history, past social history, past surgical history and problem list.  Review of Systems Review of Systems -Per history of present illness Objective:   BP 126/82   Pulse 84   Ht 5\' 2"  (1.575 m)  Wt 253 lb 6.4 oz (114.9 kg)   BMI 46.35 kg/m  CONSTITUTIONAL: Well-developed, well-nourished female in no acute distress.  HENT:  Normocephalic, atraumatic.  NECK: Not examined SKIN: Skin is warm and dry. No rash noted. Not diaphoretic. No erythema. No pallor. [place]: Alert and oriented to person, place, and time. PSYCHIATRIC: Normal mood and affect. Normal behavior. Normal judgment and thought content. CARDIOVASCULAR:Not Examined RESPIRATORY: Not Examined BREASTS: Not Examined ABDOMEN: Soft, non distended; Non tender.  No Organomegaly.  Monilia type infection under abdominal pannus PELVIC:  External Genitalia: Normal  BUS: Normal  Vagina: Normal  Cervix: Normal; no cervical motion tenderness  Uterus: Mid plane, mobile, size difficult to assess because of body habitus  Adnexa: Normal; nonpalpable and nontender  RV: External hemorrhoids; normal sphincter tone; no rectal masses or tenderness  Bladder: Nontender MUSCULOSKELETAL: Normal range of motion. No tenderness.  No cyanosis,  clubbing, or edema.     Assessment:   1. Pelvic pain in female, nonacute  2. Uterine leiomyoma, unspecified location  3. Menopause present, declines hormone replacement therapy  4. Class 2 obesity due to excess calories with body mass index (BMI) of 35.0 to 35.9 in adult, unspecified whether serious comorbidity present  5. Vasomotor symptoms due to menopause  6. Type 2 diabetes mellitus without complication, without long-term current use of insulin (HCC)  7. Monilia dermatitis, abdominal pannus     Plan:   1. Pelvic ultrasound 2. Information will be given to patient when available 3. Nystatin/triamcinolone cream topically to yeast infection twice a day for 10 days 4. Return in 1 year for gynecologic exam  A total of 30 minutes were spent face-to-face with the patient during the encounter with greater than 50% dealing with counseling and coordination of care.  Brayton Mars, MD  Note: This dictation was prepared with Dragon dictation along with smaller phrase technology. Any transcriptional errors that result from this process are unintentional.

## 2016-02-19 ENCOUNTER — Ambulatory Visit (INDEPENDENT_AMBULATORY_CARE_PROVIDER_SITE_OTHER): Payer: 59

## 2016-02-19 DIAGNOSIS — D259 Leiomyoma of uterus, unspecified: Secondary | ICD-10-CM

## 2016-02-19 DIAGNOSIS — R102 Pelvic and perineal pain: Secondary | ICD-10-CM | POA: Diagnosis not present

## 2016-03-03 DIAGNOSIS — E119 Type 2 diabetes mellitus without complications: Secondary | ICD-10-CM | POA: Diagnosis not present

## 2016-03-03 DIAGNOSIS — I10 Essential (primary) hypertension: Secondary | ICD-10-CM | POA: Diagnosis not present

## 2016-03-10 DIAGNOSIS — I10 Essential (primary) hypertension: Secondary | ICD-10-CM | POA: Diagnosis not present

## 2016-03-10 DIAGNOSIS — M25511 Pain in right shoulder: Secondary | ICD-10-CM | POA: Diagnosis not present

## 2016-03-10 DIAGNOSIS — Z6841 Body Mass Index (BMI) 40.0 and over, adult: Secondary | ICD-10-CM | POA: Diagnosis not present

## 2016-03-10 DIAGNOSIS — E119 Type 2 diabetes mellitus without complications: Secondary | ICD-10-CM | POA: Diagnosis not present

## 2016-04-01 ENCOUNTER — Encounter: Payer: Self-pay | Admitting: Physician Assistant

## 2016-04-01 ENCOUNTER — Ambulatory Visit: Payer: Self-pay | Admitting: Physician Assistant

## 2016-04-01 VITALS — BP 130/80 | HR 72 | Temp 98.3°F

## 2016-04-01 DIAGNOSIS — J0100 Acute maxillary sinusitis, unspecified: Secondary | ICD-10-CM

## 2016-04-01 MED ORDER — FLUCONAZOLE 150 MG PO TABS: 150.0000 mg | ORAL_TABLET | Freq: Once | ORAL | 0 refills | Status: AC

## 2016-04-01 MED ORDER — AZITHROMYCIN 250 MG PO TABS: ORAL_TABLET | ORAL | 0 refills | Status: DC

## 2016-04-01 NOTE — Progress Notes (Signed)
S: C/o runny nose and congestion for 3 days, no fever, chills, cp/sob, v/d; mucus is green and thick, cough is sporadic, c/o of facial and dental pain.   Using otc meds:   O: PE: vitals wnl, nad, perrl eomi, normocephalic, tms dull, nasal mucosa red and swollen, throat injected, neck supple no lymph, lungs c t a, cv rrr, neuro intact  A:  Acute sinusitis   P: drink fluids, continue regular meds , use otc meds of choice, return if not improving in 5 days, return earlier if worsening , zpack

## 2016-06-29 ENCOUNTER — Ambulatory Visit: Payer: Self-pay | Admitting: Physician Assistant

## 2016-06-29 ENCOUNTER — Encounter: Payer: Self-pay | Admitting: Physician Assistant

## 2016-06-29 VITALS — BP 120/80 | HR 85 | Temp 97.7°F

## 2016-06-29 DIAGNOSIS — M545 Low back pain, unspecified: Secondary | ICD-10-CM

## 2016-06-29 DIAGNOSIS — E7800 Pure hypercholesterolemia, unspecified: Secondary | ICD-10-CM | POA: Insufficient documentation

## 2016-06-29 DIAGNOSIS — J301 Allergic rhinitis due to pollen: Secondary | ICD-10-CM

## 2016-06-29 LAB — POCT URINALYSIS DIPSTICK
Bilirubin, UA: NEGATIVE
Blood, UA: NEGATIVE
Glucose, UA: NEGATIVE
Ketones, UA: NEGATIVE
Leukocytes, UA: NEGATIVE
Nitrite, UA: NEGATIVE
PH UA: 5.5
PROTEIN UA: NEGATIVE
UROBILINOGEN UA: 0.2

## 2016-06-29 MED ORDER — FEXOFENADINE-PSEUDOEPHED ER 60-120 MG PO TB12: 1.0000 | ORAL_TABLET | Freq: Two times a day (BID) | ORAL | 0 refills | Status: DC

## 2016-06-29 MED ORDER — FLUTICASONE PROPIONATE 50 MCG/ACT NA SUSP: 2.0000 | Freq: Every day | NASAL | 6 refills | Status: DC

## 2016-06-29 MED ORDER — BENZONATATE 100 MG PO CAPS: 100.0000 mg | ORAL_CAPSULE | Freq: Three times a day (TID) | ORAL | 0 refills | Status: DC | PRN

## 2016-06-29 NOTE — Progress Notes (Addendum)
Pt called and is not better, ?if could get a zpack, sent rx to [facility]    Patient ID: Patricia Sherman, female    DOB: 1960-10-08, 56 y.o.   MRN: [id number]  HPI Patient c/o sneezing, cough, nasal congestion, watery eyes, and post nasal drainage for one week. Denies fever/chill, or N/V/D. Patient states allergra gives only transit relief.   Review of Systems     HTN, DM, and Hyperlipidema. Objective:   Physical Exam Bilateral erythematous conjunctiva, edematous nasal turbinates with clear rhinorrhea. Post nasl drainage. Neck supple, w/o adenopathy. Lung CTA and Heart RRR.       Assessment & Plan: Seasonal rhinitis.  Allergra-D, Flonase, and Tessalon.  Follow up PRN

## 2016-07-05 DIAGNOSIS — I10 Essential (primary) hypertension: Secondary | ICD-10-CM | POA: Diagnosis not present

## 2016-07-05 DIAGNOSIS — E119 Type 2 diabetes mellitus without complications: Secondary | ICD-10-CM | POA: Diagnosis not present

## 2016-07-05 DIAGNOSIS — Z6841 Body Mass Index (BMI) 40.0 and over, adult: Secondary | ICD-10-CM | POA: Diagnosis not present

## 2016-07-05 DIAGNOSIS — M25511 Pain in right shoulder: Secondary | ICD-10-CM | POA: Diagnosis not present

## 2016-07-07 DIAGNOSIS — E119 Type 2 diabetes mellitus without complications: Secondary | ICD-10-CM | POA: Diagnosis not present

## 2016-07-07 DIAGNOSIS — N898 Other specified noninflammatory disorders of vagina: Secondary | ICD-10-CM | POA: Diagnosis not present

## 2016-07-07 DIAGNOSIS — J301 Allergic rhinitis due to pollen: Secondary | ICD-10-CM | POA: Diagnosis not present

## 2016-07-07 DIAGNOSIS — I10 Essential (primary) hypertension: Secondary | ICD-10-CM | POA: Diagnosis not present

## 2016-07-11 ENCOUNTER — Telehealth: Payer: Self-pay | Admitting: Physician Assistant

## 2016-07-11 MED ORDER — AZITHROMYCIN 250 MG PO TABS: ORAL_TABLET | ORAL | 0 refills | Status: DC

## 2016-07-11 NOTE — Telephone Encounter (Signed)
Sent rx to [facility]

## 2016-07-11 NOTE — Addendum Note (Signed)
Addended by: Versie Starks on: 07/11/2016 10:25 AM   Modules accepted: Orders

## 2016-10-16 DIAGNOSIS — H0011 Chalazion right upper eyelid: Secondary | ICD-10-CM | POA: Diagnosis not present

## 2016-10-25 ENCOUNTER — Ambulatory Visit: Payer: Self-pay | Admitting: Physician Assistant

## 2016-10-31 DIAGNOSIS — E119 Type 2 diabetes mellitus without complications: Secondary | ICD-10-CM | POA: Diagnosis not present

## 2016-10-31 DIAGNOSIS — N898 Other specified noninflammatory disorders of vagina: Secondary | ICD-10-CM | POA: Diagnosis not present

## 2016-10-31 DIAGNOSIS — I10 Essential (primary) hypertension: Secondary | ICD-10-CM | POA: Diagnosis not present

## 2016-10-31 DIAGNOSIS — J301 Allergic rhinitis due to pollen: Secondary | ICD-10-CM | POA: Diagnosis not present

## 2016-11-07 DIAGNOSIS — I10 Essential (primary) hypertension: Secondary | ICD-10-CM | POA: Diagnosis not present

## 2016-11-07 DIAGNOSIS — J302 Other seasonal allergic rhinitis: Secondary | ICD-10-CM | POA: Diagnosis not present

## 2016-11-07 DIAGNOSIS — E119 Type 2 diabetes mellitus without complications: Secondary | ICD-10-CM | POA: Diagnosis not present

## 2016-11-07 DIAGNOSIS — L739 Follicular disorder, unspecified: Secondary | ICD-10-CM | POA: Diagnosis not present

## 2016-11-11 ENCOUNTER — Other Ambulatory Visit: Payer: Self-pay | Admitting: Internal Medicine

## 2016-11-11 DIAGNOSIS — Z1231 Encounter for screening mammogram for malignant neoplasm of breast: Secondary | ICD-10-CM

## 2017-01-03 ENCOUNTER — Ambulatory Visit
Admission: RE | Admit: 2017-01-03 | Discharge: 2017-01-03 | Disposition: A | Discharge status: Home / Self Care | Payer: 59 | Source: Ambulatory Visit | Attending: Internal Medicine | Admitting: Internal Medicine

## 2017-01-03 DIAGNOSIS — Z1231 Encounter for screening mammogram for malignant neoplasm of breast: Secondary | ICD-10-CM | POA: Diagnosis not present

## 2017-01-09 ENCOUNTER — Ambulatory Visit: Payer: Self-pay | Admitting: Family

## 2017-01-09 VITALS — BP 130/84 | HR 86 | Temp 98.7°F

## 2017-01-09 DIAGNOSIS — J302 Other seasonal allergic rhinitis: Secondary | ICD-10-CM

## 2017-01-09 MED ORDER — FLUCONAZOLE 150 MG PO TABS: ORAL_TABLET | ORAL | 0 refills | Status: DC

## 2017-01-09 MED ORDER — LEVOCETIRIZINE DIHYDROCHLORIDE 5 MG PO TABS: 5.0000 mg | ORAL_TABLET | Freq: Every evening | ORAL | 5 refills | Status: DC

## 2017-01-09 NOTE — Progress Notes (Signed)
S/: one-week history of nasal congestion , rhinorrhea, sneezing not relieved with herAllegra , her PCP called in a Z-Pack 3 days ago with slight improvement of symptoms  denies fever  ,chills , body aches or systemic symtoms  O/: vital signs stable pleasant no acute distress ENT left TM dull retracted right occluded with wax nasal mucosa swollen allergic appearing no facial tenderness, pharynx not fully visualized due to hyper active gag reflex but appears clear neck supple without nodes heart RSR lungs are clear   A/ : allergic rhinoinusitis    P/: Rx Xyzal. allergy tips reviewed and encouraged  Follow up when necessary not improving. Diflucan given for when necessary use

## 2017-02-03 ENCOUNTER — Ambulatory Visit: Payer: Self-pay | Admitting: Physician Assistant

## 2017-02-09 ENCOUNTER — Encounter: Payer: 59 | Admitting: Obstetrics and Gynecology

## 2017-02-16 DIAGNOSIS — E119 Type 2 diabetes mellitus without complications: Secondary | ICD-10-CM | POA: Diagnosis not present

## 2017-03-09 DIAGNOSIS — E119 Type 2 diabetes mellitus without complications: Secondary | ICD-10-CM | POA: Diagnosis not present

## 2017-03-09 DIAGNOSIS — L739 Follicular disorder, unspecified: Secondary | ICD-10-CM | POA: Diagnosis not present

## 2017-03-09 DIAGNOSIS — I10 Essential (primary) hypertension: Secondary | ICD-10-CM | POA: Diagnosis not present

## 2017-03-09 DIAGNOSIS — J302 Other seasonal allergic rhinitis: Secondary | ICD-10-CM | POA: Diagnosis not present

## 2017-03-15 ENCOUNTER — Ambulatory Visit (INDEPENDENT_AMBULATORY_CARE_PROVIDER_SITE_OTHER): Payer: 59 | Admitting: Obstetrics and Gynecology

## 2017-03-15 ENCOUNTER — Encounter: Payer: Self-pay | Admitting: Obstetrics and Gynecology

## 2017-03-15 VITALS — BP 116/78 | HR 89 | Ht 62.0 in | Wt 248.8 lb

## 2017-03-15 DIAGNOSIS — N812 Incomplete uterovaginal prolapse: Secondary | ICD-10-CM | POA: Diagnosis not present

## 2017-03-15 DIAGNOSIS — N951 Menopausal and female climacteric states: Secondary | ICD-10-CM | POA: Diagnosis not present

## 2017-03-15 DIAGNOSIS — Z01419 Encounter for gynecological examination (general) (routine) without abnormal findings: Secondary | ICD-10-CM

## 2017-03-15 DIAGNOSIS — Z5320 Procedure and treatment not carried out because of patient's decision for unspecified reasons: Secondary | ICD-10-CM | POA: Diagnosis not present

## 2017-03-15 DIAGNOSIS — Z780 Asymptomatic menopausal state: Secondary | ICD-10-CM

## 2017-03-15 DIAGNOSIS — D259 Leiomyoma of uterus, unspecified: Secondary | ICD-10-CM

## 2017-03-15 DIAGNOSIS — E66813 Obesity, class 3: Secondary | ICD-10-CM

## 2017-03-15 NOTE — Progress Notes (Signed)
ANNUAL PREVENTATIVE CARE GYN  ENCOUNTER NOTE  Subjective:       Patricia Sherman is a 56 y.o. [phone] female here for a routine annual gynecologic exam.  Current complaints: 1.  None  Bowel function is normal. Bladder function is normal. No major interval health issues have been identified. Patient is not currently exercising.   Gynecologic History No LMP recorded. Patient is postmenopausal. Contraception: post menopausal status Last Pap: unsure Results were: normal Last mammogram:12/2016 birad 1 . Results were: normal  Obstetric History OB History  Gravida Para Term Preterm AB Living  5 2 2   3 2   SAB TAB Ectopic Multiple Live Births    0     2    # Outcome Date GA Lbr Len/2nd Weight Sex Delivery Anes PTL Lv  5 Term 1992   7 lb 6.4 oz (3.357 kg) F Vag-Spont   LIV  4 Term 1985   6 lb 11.2 oz (3.039 kg) F Vag-Spont   LIV  3 AB           2 AB           1 AB               Past Medical History:  Diagnosis Date  . Diabetes mellitus without complication ([place])   . Environmental allergies   . Hypertension     Past Surgical History:  Procedure Laterality Date  . CATARACT EXTRACTION    . TONSILLECTOMY    . TUBAL LIGATION      Current Outpatient Medications on File Prior to Visit  Medication Sig Dispense Refill  . aspirin 81 MG chewable tablet Chew by mouth daily.    Marland Kitchen atorvastatin (LIPITOR) 10 MG tablet Take 10 mg daily by mouth.    . fexofenadine-pseudoephedrine (ALLEGRA-D) 60-120 MG 12 hr tablet Take 1 tablet by mouth 2 (two) times daily. 20 tablet 0  . glimepiride (AMARYL) 1 MG tablet Take by mouth.    Marland Kitchen JANUVIA 100 MG tablet   5  . losartan (COZAAR) 100 MG tablet Take by mouth.    Marland Kitchen glimepiride (AMARYL) 2 MG tablet   3   No current facility-administered medications on file prior to visit.     Allergies  Allergen Reactions  . Metformin Diarrhea    Other reaction(s): Other (See Comments) Elevated heart rate  . Sulfa Antibiotics Rash    Social History    Socioeconomic History  . Marital status: Single    Spouse name: Not on file  . Number of children: Not on file  . Years of education: Not on file  . Highest education level: Not on file  Social Needs  . Financial resource strain: Not on file  . Food insecurity - worry: Not on file  . Food insecurity - inability: Not on file  . Transportation needs - medical: Not on file  . Transportation needs - non-medical: Not on file  Occupational History  . Not on file  Tobacco Use  . Smoking status: Never Smoker  . Smokeless tobacco: Never Used  Substance and Sexual Activity  . Alcohol use: No    Alcohol/week: 0.0 oz  . Drug use: No  . Sexual activity: Not Currently  Other Topics Concern  . Not on file  Social History Narrative  . Not on file    Family History  Problem Relation Age of Onset  . Heart disease Mother   . Diabetes Sister   . Heart disease Brother   .  Diabetes Daughter   . Breast cancer Neg Hx   . Ovarian cancer Neg Hx   . Colon cancer Neg Hx     The following portions of the patient's history were reviewed and updated as appropriate: allergies, current medications, past family history, past medical history, past social history, past surgical history and problem list.  Review of Systems ROS Review of Systems - General ROS: negative for - chills, fatigue, fever, night sweats, weight gain or weight loss.  POSITIVE-hot flashes (not desiring HRT) Psychological ROS: negative for - anxiety, decreased libido, depression, mood swings, physical abuse or sexual abuse Ophthalmic ROS: negative for - blurry vision, eye pain or loss of vision ENT ROS: negative for - headaches, hearing change, visual changes or vocal changes Allergy and Immunology ROS: negative for - hives, itchy/watery eyes or seasonal allergies Hematological and Lymphatic ROS: negative for - bleeding problems, bruising, swollen lymph nodes or weight loss Endocrine ROS: negative for - galactorrhea, hair pattern  changes,malaise/lethargy, mood swings, palpitations, polydipsia/polyuria, skin changes, temperature intolerance or unexpected weight changes Breast ROS: negative for - new or changing breast lumps or nipple discharge Respiratory ROS: negative for - cough or shortness of breath Cardiovascular ROS: negative for - chest pain, irregular heartbeat, palpitations or shortness of breath Gastrointestinal ROS: no abdominal pain, change in bowel habits, or black or bloody stools Genito-Urinary ROS: no dysuria, trouble voiding, or hematuria Musculoskeletal ROS: negative for - joint pain or joint stiffness Neurological ROS: negative for - bowel and bladder control changes Dermatological ROS: negative for rash and skin lesion changes   Objective:   BP 116/78   Pulse 89   Ht 5\' 2"  (1.575 m)   Wt 248 lb 12.8 oz (112.9 kg)   BMI 45.51 kg/m  CONSTITUTIONAL: Well-developed, well-nourished female in no acute distress.  PSYCHIATRIC: Normal mood and affect. Normal behavior. Normal judgment and thought content. Berthold: Alert and oriented to person, place, and time. Normal muscle tone coordination. No cranial nerve deficit noted. HENT:  Normocephalic, atraumatic, External right and left ear normal. Oropharynx is clear and moist EYES: Conjunctivae and EOM are normal. No scleral icterus.  NECK: Normal range of motion, supple, no masses.  Normal thyroid.  SKIN: Skin is warm and dry. No rash noted. Not diaphoretic. No erythema. No pallor. CARDIOVASCULAR: Normal heart rate noted, regular rhythm, no murmur. RESPIRATORY: Clear to auscultation bilaterally. Effort and breath sounds normal, no problems with respiration noted. BREASTS: Symmetric in size. No masses, skin changes, nipple drainage, or lymphadenopathy. ABDOMEN: Soft, normal bowel sounds, no distention noted.  No tenderness, rebound or guarding.  BLADDER: Normal PELVIC: Graves speculum  External Genitalia: Normal  BUS: Normal  Vagina: Normal; good  estrogen effect; redundant vaginal sidewalls  Cervix: Normal; parous; prolapsed to within 2 cm of the introitus; no cervical motion tenderness  Uterus: Normal; midplane, normal size shape, mobile, nontender  Adnexa: Normal; nonpalpable nontender  RV: External Exam NormaI, No Rectal Masses and Normal Sphincter tone  MUSCULOSKELETAL: Normal range of motion. No tenderness.  No cyanosis, clubbing, or edema.  2+ distal pulses. LYMPHATIC: No Axillary, Supraclavicular, or Inguinal Adenopathy.    Assessment:   Annual gynecologic examination 56 y.o. Contraception: post menopausal status [id number] Problem List Items Addressed This Visit    Uterine leiomyoma   Menopause present, declines hormone replacement therapy   Obesity, Class III, BMI 40-49.9 (morbid obesity) ([place])   Vasomotor symptoms due to menopause    Other Visit Diagnoses    Well woman exam with routine gynecological  exam    -  Primary    Uterine prolapse, incomplete, asymptomatic  Plan:  Pap: Pap Co Test Mammogram: utd Stool Guaiac Testing:  needs colonscopy- will order thru pcp Labs: thru pcp Routine preventative health maintenance measures emphasized: Exercise/Diet/Weight control, Tobacco Warnings and Alcohol/Substance use risks Return to [place], CMA  Brayton Mars, MD  Note: This dictation was prepared with Dragon dictation along with smaller phrase technology. Any transcriptional errors that result from this process are unintentional.

## 2017-03-15 NOTE — Patient Instructions (Addendum)
1.  Pap smear is done 2.  Mammogram already obtained 3.  Colonoscopy is to be obtained this year through primary care 4.  Screening labs are to be obtained through primary care 5.  Continue with healthy eating, exercise, and control weight loss 6.  Calcium with vitamin D supplementation is recommended daily-1200 mg calcium; 800 international units of vitamin D. Sources of calcium:  Citracal  Os-Cal  Viactiv chews  Tums 7.  Return in 1 year for annual exam   Health Maintenance for Postmenopausal Women Menopause is a normal process in which your reproductive ability to an end. This process happens gradually over a span of months to years, usually between the ages of 22 and 75. Menopause is complete when you have missed 12 consecutive menstrual periods. It is important to talk with your health care provider about some of the most common conditions that affect postmenopausal women, such as heart disease, cancer, and bone loss (osteoporosis). Adopting a healthy lifestyle and getting preventive care can help to promote your health and wellness. Those actions can also lower your chances of developing some of these common conditions. What should I know about menopause? During menopause, you may experience a number of symptoms, such as:  Moderate-to-severe hot flashes.  Night sweats.  Decrease in sex drive.  Mood swings.  Headaches.  Tiredness.  Irritability.  Memory problems.  Insomnia.  Choosing to treat or not to treat menopausal changes is an individual decision that you make with your health care provider. What should I know about hormone replacement therapy and supplements? Hormone therapy products are effective for treating symptoms that are associated with menopause, such as hot flashes and night sweats. Hormone replacement carries certain risks, especially as you become older. If you are thinking about using estrogen or estrogen with progestin treatments, discuss the benefits  and risks with your health care provider. What should I know about heart disease and stroke? Heart disease, heart attack, and stroke become more likely as you age. This may be due, in part, to the hormonal changes that your body experiences during menopause. These can affect how your body processes dietary fats, triglycerides, and cholesterol. Heart attack and stroke are both medical emergencies. There are many things that you can do to help prevent heart disease and stroke:  Have your blood pressure checked at least every 1-2 years. High blood pressure causes heart disease and increases the risk of stroke.  If you are 58-61 years old, ask your health care provider if you should take aspirin to prevent a heart attack or a stroke.  Do not use any tobacco products, including cigarettes, chewing tobacco, or electronic cigarettes. If you need help quitting, ask your health care provider.  It is important to eat a healthy diet and maintain a healthy weight. ? Be sure to include plenty of vegetables, fruits, low-fat dairy products, and lean protein. ? Avoid eating foods that are high in solid fats, added sugars, or salt (sodium).  Get regular exercise. This is one of the most important things that you can do for your health. ? Try to exercise for at least 150 minutes each week. The type of exercise that you do should increase your heart rate and make you sweat. This is known as moderate-intensity exercise. ? Try to do strengthening exercises at least twice each week. Do these in addition to the moderate-intensity exercise.  Know your numbers.Ask your health care provider to check your cholesterol and your blood glucose. Continue to have  your blood tested as directed by your health care provider.  What should I know about cancer screening? There are several types of cancer. Take the following steps to reduce your risk and to catch any cancer development as early as possible. Breast  Cancer  Practice breast self-awareness. ? This means understanding how your breasts normally appear and feel. ? It also means doing regular breast self-exams. Let your health care provider know about any changes, no matter how small.  If you are 55 or older, have a clinician do a breast exam (clinical breast exam or CBE) every year. Depending on your age, family history, and medical history, it may be recommended that you also have a yearly breast X-ray (mammogram).  If you have a family history of breast cancer, talk with your health care provider about genetic screening.  If you are at high risk for breast cancer, talk with your health care provider about having an MRI and a mammogram every year.  Breast cancer (BRCA) gene test is recommended for women who have family members with BRCA-related cancers. Results of the assessment will determine the need for genetic counseling and BRCA1 and for BRCA2 testing. BRCA-related cancers include these types: ? Breast. This occurs in males or females. ? Ovarian. ? Tubal. This may also be called fallopian tube cancer. ? Cancer of the abdominal or pelvic lining (peritoneal cancer). ? Prostate. ? Pancreatic.  Cervical, Uterine, and Ovarian Cancer Your health care provider may recommend that you be screened regularly for cancer of the pelvic organs. These include your ovaries, uterus, and vagina. This screening involves a pelvic exam, which includes checking for microscopic changes to the surface of your cervix (Pap test).  For women ages 21-65, health care providers may recommend a pelvic exam and a Pap test every three years. For women ages 24-65, they may recommend the Pap test and pelvic exam, combined with testing for human papilloma virus (HPV), every five years. Some types of HPV increase your risk of cervical cancer. Testing for HPV may also be done on women of any age who have unclear Pap test results.  Other health care providers may not  recommend any screening for nonpregnant women who are considered low risk for pelvic cancer and have no symptoms. Ask your health care provider if a screening pelvic exam is right for you.  If you have had past treatment for cervical cancer or a condition that could lead to cancer, you need Pap tests and screening for cancer for at least 20 years after your treatment. If Pap tests have been discontinued for you, your risk factors (such as having a new sexual partner) need to be reassessed to determine if you should start having screenings again. Some women have medical problems that increase the chance of getting cervical cancer. In these cases, your health care provider may recommend that you have screening and Pap tests more often.  If you have a family history of uterine cancer or ovarian cancer, talk with your health care provider about genetic screening.  If you have vaginal bleeding after reaching menopause, tell your health care provider.  There are currently no reliable tests available to screen for ovarian cancer.  Lung Cancer Lung cancer screening is recommended for adults 68-56 years old who are at high risk for lung cancer because of a history of smoking. A yearly low-dose CT scan of the lungs is recommended if you:  Currently smoke.  Have a history of at least 30 pack-years of  smoking and you currently smoke or have quit within the past 15 years. A pack-year is smoking an average of one pack of cigarettes per day for one year.  Yearly screening should:  Continue until it has been 15 years since you quit.  Stop if you develop a health problem that would prevent you from having lung cancer treatment.  Colorectal Cancer  This type of cancer can be detected and can often be prevented.  Routine colorectal cancer screening usually begins at age 46 and continues through age 75.  If you have risk factors for colon cancer, your health care provider may recommend that you be screened  at an earlier age.  If you have a family history of colorectal cancer, talk with your health care provider about genetic screening.  Your health care provider may also recommend using home test kits to check for hidden blood in your stool.  A small camera at the end of a tube can be used to examine your colon directly (sigmoidoscopy or colonoscopy). This is done to check for the earliest forms of colorectal cancer.  Direct examination of the colon should be repeated every 5-10 years until age 74. However, if early forms of precancerous polyps or small growths are found or if you have a family history or genetic risk for colorectal cancer, you may need to be screened more often.  Skin Cancer  Check your skin from head to toe regularly.  Monitor any moles. Be sure to tell your health care provider: ? About any new moles or changes in moles, especially if there is a change in a mole's shape or color. ? If you have a mole that is larger than the size of a pencil eraser.  If any of your family members has a history of skin cancer, especially at a young age, talk with your health care provider about genetic screening.  Always use sunscreen. Apply sunscreen liberally and repeatedly throughout the day.  Whenever you are outside, protect yourself by wearing long sleeves, pants, a wide-brimmed hat, and sunglasses.  What should I know about osteoporosis? Osteoporosis is a condition in which bone destruction happens more quickly than new bone creation. After menopause, you may be at an increased risk for osteoporosis. To help prevent osteoporosis or the bone fractures that can happen because of osteoporosis, the following is recommended:  If you are 66-45 years old, get at least 1,000 mg of calcium and at least 600 mg of vitamin D per day.  If you are older than age 69 but younger than age 20, get at least 1,200 mg of calcium and at least 600 mg of vitamin D per day.  If you are older than age 70,  get at least 1,200 mg of calcium and at least 800 mg of vitamin D per day.  Smoking and excessive alcohol intake increase the risk of osteoporosis. Eat foods that are rich in calcium and vitamin D, and do weight-bearing exercises several times each week as directed by your health care provider. What should I know about how menopause affects my mental health? Depression may occur at any age, but it is more common as you become older. Common symptoms of depression include:  Low or sad mood.  Changes in sleep patterns.  Changes in appetite or eating patterns.  Feeling an overall lack of motivation or enjoyment of activities that you previously enjoyed.  Frequent crying spells.  Talk with your health care provider if you think that you are  experiencing depression. What should I know about immunizations? It is important that you get and maintain your immunizations. These include:  Tetanus, diphtheria, and pertussis (Tdap) booster vaccine.  Influenza every year before the flu season begins.  Pneumonia vaccine.  Shingles vaccine.  Your health care provider may also recommend other immunizations. This information is not intended to replace advice given to you by your health care provider. Make sure you discuss any questions you have with your health care provider. Document Released: 06/10/2005 Document Revised: 11/06/2015 Document Reviewed: 01/20/2015 Elsevier Interactive Patient Education  2018 [employer].

## 2017-03-16 DIAGNOSIS — I10 Essential (primary) hypertension: Secondary | ICD-10-CM | POA: Diagnosis not present

## 2017-03-16 DIAGNOSIS — Z6841 Body Mass Index (BMI) 40.0 and over, adult: Secondary | ICD-10-CM | POA: Diagnosis not present

## 2017-03-16 DIAGNOSIS — E119 Type 2 diabetes mellitus without complications: Secondary | ICD-10-CM | POA: Diagnosis not present

## 2017-03-16 DIAGNOSIS — Z0000 Encounter for general adult medical examination without abnormal findings: Secondary | ICD-10-CM | POA: Diagnosis not present

## 2017-03-17 LAB — IGP, COBASHPV16/18
HPV 16: NEGATIVE
HPV 18: NEGATIVE
HPV other hr types: NEGATIVE
PAP Smear Comment: 0

## 2017-05-09 DIAGNOSIS — M778 Other enthesopathies, not elsewhere classified: Secondary | ICD-10-CM | POA: Diagnosis not present

## 2017-05-31 LAB — HM HIV SCREENING LAB: HM HIV Screening: NEGATIVE

## 2017-06-21 DIAGNOSIS — J302 Other seasonal allergic rhinitis: Secondary | ICD-10-CM | POA: Diagnosis not present

## 2017-06-21 DIAGNOSIS — L02211 Cutaneous abscess of abdominal wall: Secondary | ICD-10-CM | POA: Diagnosis not present

## 2017-06-21 DIAGNOSIS — E119 Type 2 diabetes mellitus without complications: Secondary | ICD-10-CM | POA: Diagnosis not present

## 2017-09-13 DIAGNOSIS — Z6841 Body Mass Index (BMI) 40.0 and over, adult: Secondary | ICD-10-CM | POA: Diagnosis not present

## 2017-09-13 DIAGNOSIS — E119 Type 2 diabetes mellitus without complications: Secondary | ICD-10-CM | POA: Diagnosis not present

## 2017-09-13 DIAGNOSIS — I10 Essential (primary) hypertension: Secondary | ICD-10-CM | POA: Diagnosis not present

## 2017-09-13 DIAGNOSIS — R8290 Unspecified abnormal findings in urine: Secondary | ICD-10-CM | POA: Diagnosis not present

## 2017-09-13 DIAGNOSIS — Z0000 Encounter for general adult medical examination without abnormal findings: Secondary | ICD-10-CM | POA: Diagnosis not present

## 2017-09-20 DIAGNOSIS — I10 Essential (primary) hypertension: Secondary | ICD-10-CM | POA: Diagnosis not present

## 2017-09-20 DIAGNOSIS — E119 Type 2 diabetes mellitus without complications: Secondary | ICD-10-CM | POA: Diagnosis not present

## 2017-09-20 DIAGNOSIS — J302 Other seasonal allergic rhinitis: Secondary | ICD-10-CM | POA: Diagnosis not present

## 2017-09-20 DIAGNOSIS — Z6841 Body Mass Index (BMI) 40.0 and over, adult: Secondary | ICD-10-CM | POA: Diagnosis not present

## 2017-12-14 DIAGNOSIS — J302 Other seasonal allergic rhinitis: Secondary | ICD-10-CM | POA: Diagnosis not present

## 2017-12-14 DIAGNOSIS — Z6841 Body Mass Index (BMI) 40.0 and over, adult: Secondary | ICD-10-CM | POA: Diagnosis not present

## 2017-12-14 DIAGNOSIS — E119 Type 2 diabetes mellitus without complications: Secondary | ICD-10-CM | POA: Diagnosis not present

## 2017-12-14 DIAGNOSIS — I10 Essential (primary) hypertension: Secondary | ICD-10-CM | POA: Diagnosis not present

## 2017-12-21 DIAGNOSIS — I10 Essential (primary) hypertension: Secondary | ICD-10-CM | POA: Diagnosis not present

## 2017-12-21 DIAGNOSIS — E119 Type 2 diabetes mellitus without complications: Secondary | ICD-10-CM | POA: Diagnosis not present

## 2017-12-21 DIAGNOSIS — Z6841 Body Mass Index (BMI) 40.0 and over, adult: Secondary | ICD-10-CM | POA: Diagnosis not present

## 2017-12-21 DIAGNOSIS — J302 Other seasonal allergic rhinitis: Secondary | ICD-10-CM | POA: Diagnosis not present

## 2018-01-26 ENCOUNTER — Other Ambulatory Visit: Payer: Self-pay | Admitting: Internal Medicine

## 2018-01-26 DIAGNOSIS — Z1231 Encounter for screening mammogram for malignant neoplasm of breast: Secondary | ICD-10-CM

## 2018-04-06 DIAGNOSIS — Z113 Encounter for screening for infections with a predominantly sexual mode of transmission: Secondary | ICD-10-CM | POA: Diagnosis not present

## 2018-04-10 DIAGNOSIS — E119 Type 2 diabetes mellitus without complications: Secondary | ICD-10-CM | POA: Diagnosis not present

## 2018-04-26 DIAGNOSIS — E1165 Type 2 diabetes mellitus with hyperglycemia: Secondary | ICD-10-CM | POA: Diagnosis not present

## 2018-04-26 DIAGNOSIS — J302 Other seasonal allergic rhinitis: Secondary | ICD-10-CM | POA: Diagnosis not present

## 2018-04-26 DIAGNOSIS — J0111 Acute recurrent frontal sinusitis: Secondary | ICD-10-CM | POA: Diagnosis not present

## 2018-04-26 DIAGNOSIS — I10 Essential (primary) hypertension: Secondary | ICD-10-CM | POA: Diagnosis not present

## 2018-04-26 DIAGNOSIS — Z6841 Body Mass Index (BMI) 40.0 and over, adult: Secondary | ICD-10-CM | POA: Diagnosis not present

## 2018-04-26 DIAGNOSIS — R8290 Unspecified abnormal findings in urine: Secondary | ICD-10-CM | POA: Diagnosis not present

## 2018-04-26 DIAGNOSIS — E119 Type 2 diabetes mellitus without complications: Secondary | ICD-10-CM | POA: Diagnosis not present

## 2018-05-04 ENCOUNTER — Ambulatory Visit
Admission: RE | Admit: 2018-05-04 | Discharge: 2018-05-04 | Disposition: A | Discharge status: Home / Self Care | Payer: 59 | Source: Ambulatory Visit | Attending: Internal Medicine | Admitting: Internal Medicine

## 2018-05-04 DIAGNOSIS — Z1231 Encounter for screening mammogram for malignant neoplasm of breast: Secondary | ICD-10-CM | POA: Insufficient documentation

## 2018-05-24 DIAGNOSIS — I10 Essential (primary) hypertension: Secondary | ICD-10-CM | POA: Diagnosis not present

## 2018-05-24 DIAGNOSIS — Z1211 Encounter for screening for malignant neoplasm of colon: Secondary | ICD-10-CM | POA: Diagnosis not present

## 2018-05-24 DIAGNOSIS — Z8371 Family history of colonic polyps: Secondary | ICD-10-CM | POA: Diagnosis not present

## 2018-05-24 DIAGNOSIS — E119 Type 2 diabetes mellitus without complications: Secondary | ICD-10-CM | POA: Diagnosis not present

## 2018-07-11 DIAGNOSIS — R6889 Other general symptoms and signs: Secondary | ICD-10-CM | POA: Diagnosis not present

## 2018-07-11 DIAGNOSIS — R040 Epistaxis: Secondary | ICD-10-CM | POA: Diagnosis not present

## 2018-07-11 DIAGNOSIS — J101 Influenza due to other identified influenza virus with other respiratory manifestations: Secondary | ICD-10-CM | POA: Diagnosis not present

## 2018-07-20 ENCOUNTER — Encounter: Payer: 59 | Admitting: Obstetrics and Gynecology

## 2018-08-07 DIAGNOSIS — E119 Type 2 diabetes mellitus without complications: Secondary | ICD-10-CM | POA: Diagnosis not present

## 2018-08-07 DIAGNOSIS — R040 Epistaxis: Secondary | ICD-10-CM | POA: Diagnosis not present

## 2018-08-07 DIAGNOSIS — I10 Essential (primary) hypertension: Secondary | ICD-10-CM | POA: Diagnosis not present

## 2018-08-07 DIAGNOSIS — Z6841 Body Mass Index (BMI) 40.0 and over, adult: Secondary | ICD-10-CM | POA: Diagnosis not present

## 2018-08-09 ENCOUNTER — Encounter: Payer: 59 | Admitting: Obstetrics and Gynecology

## 2018-08-22 DIAGNOSIS — J0111 Acute recurrent frontal sinusitis: Secondary | ICD-10-CM | POA: Diagnosis not present

## 2018-08-22 DIAGNOSIS — Z6841 Body Mass Index (BMI) 40.0 and over, adult: Secondary | ICD-10-CM | POA: Diagnosis not present

## 2018-08-22 DIAGNOSIS — I10 Essential (primary) hypertension: Secondary | ICD-10-CM | POA: Diagnosis not present

## 2018-08-22 DIAGNOSIS — E1165 Type 2 diabetes mellitus with hyperglycemia: Secondary | ICD-10-CM | POA: Diagnosis not present

## 2018-08-22 DIAGNOSIS — Z0000 Encounter for general adult medical examination without abnormal findings: Secondary | ICD-10-CM | POA: Diagnosis not present

## 2018-08-27 DIAGNOSIS — I10 Essential (primary) hypertension: Secondary | ICD-10-CM | POA: Diagnosis not present

## 2018-08-27 DIAGNOSIS — E1165 Type 2 diabetes mellitus with hyperglycemia: Secondary | ICD-10-CM | POA: Diagnosis not present

## 2018-08-27 DIAGNOSIS — J302 Other seasonal allergic rhinitis: Secondary | ICD-10-CM | POA: Diagnosis not present

## 2018-08-27 DIAGNOSIS — Z6841 Body Mass Index (BMI) 40.0 and over, adult: Secondary | ICD-10-CM | POA: Diagnosis not present

## 2018-09-14 DIAGNOSIS — R040 Epistaxis: Secondary | ICD-10-CM | POA: Diagnosis not present

## 2018-11-01 ENCOUNTER — Encounter: Payer: 59 | Admitting: Obstetrics and Gynecology

## 2018-11-06 ENCOUNTER — Telehealth: Payer: Self-pay

## 2018-11-06 NOTE — Progress Notes (Signed)
Pt is present for annual exam. Pt stated that she has been doing self-breast exams monthly. Pt stated that she is doing well and denies any issues. No problems or concerns.

## 2018-11-06 NOTE — Telephone Encounter (Signed)
Pt called no answer LM to return call for prescreening.

## 2018-11-07 ENCOUNTER — Ambulatory Visit (INDEPENDENT_AMBULATORY_CARE_PROVIDER_SITE_OTHER): Payer: 59 | Admitting: Obstetrics and Gynecology

## 2018-11-07 ENCOUNTER — Encounter: Payer: Self-pay | Admitting: Obstetrics and Gynecology

## 2018-11-07 ENCOUNTER — Other Ambulatory Visit: Payer: Self-pay

## 2018-11-07 VITALS — BP 126/86 | HR 76 | Ht 62.0 in | Wt 244.8 lb

## 2018-11-07 DIAGNOSIS — I10 Essential (primary) hypertension: Secondary | ICD-10-CM | POA: Diagnosis not present

## 2018-11-07 DIAGNOSIS — Z01419 Encounter for gynecological examination (general) (routine) without abnormal findings: Secondary | ICD-10-CM

## 2018-11-07 DIAGNOSIS — N951 Menopausal and female climacteric states: Secondary | ICD-10-CM | POA: Diagnosis not present

## 2018-11-07 DIAGNOSIS — E66813 Obesity, class 3: Secondary | ICD-10-CM

## 2018-11-07 DIAGNOSIS — E119 Type 2 diabetes mellitus without complications: Secondary | ICD-10-CM | POA: Diagnosis not present

## 2018-11-07 DIAGNOSIS — N812 Incomplete uterovaginal prolapse: Secondary | ICD-10-CM | POA: Diagnosis not present

## 2018-11-07 DIAGNOSIS — Z1231 Encounter for screening mammogram for malignant neoplasm of breast: Secondary | ICD-10-CM | POA: Diagnosis not present

## 2018-11-07 NOTE — Progress Notes (Signed)
ANNUAL PREVENTATIVE CARE GYNECOLOGY  ENCOUNTER NOTE  Subjective:       Patricia Sherman is a 58 y.o. [id number] female here for a routine annual gynecologic exam. She has a PMH of HTN, DM, and fibroid uterus. The patient is not sexually active. The patient has never taken hormone replacement therapy. Patient denies post-menopausal vaginal bleeding. The patient wears seatbelts: no. The patient participates in regular exercise: no. Has the patient ever been transfused or tattooed?: no. The patient reports that there is not domestic violence in her life.  Current complaints: 1.  None    Gynecologic History No LMP recorded. Patient is postmenopausal. Contraception: post menopausal status Last Pap: 03/15/2017. Results were: normal Last mammogram: 05/04/2018. Results were: normal Last Colonoscopy: to be scheduled later this year (postponed due to COVID-19).    Obstetric History OB History  Gravida Para Term Preterm AB Living  5 2 2   3 2   SAB TAB Ectopic Multiple Live Births    0     2    # Outcome Date GA Lbr Len/2nd Weight Sex Delivery Anes PTL Lv  5 Term 1992   7 lb 6.4 oz (3.357 kg) F Vag-Spont   LIV  4 Term 1985   6 lb 11.2 oz (3.039 kg) F Vag-Spont   LIV  3 AB           2 AB           1 AB             Past Medical History:  Diagnosis Date  . Diabetes mellitus without complication ([place])   . Environmental allergies   . Hypertension     Family History  Problem Relation Age of Onset  . Heart disease Mother   . Hypertension Mother   . Diabetes Sister   . Hypertension Sister   . Heart disease Brother   . Hypertension Brother   . Diabetes Daughter   . Breast cancer Neg Hx   . Ovarian cancer Neg Hx   . Colon cancer Neg Hx     Past Surgical History:  Procedure Laterality Date  . CATARACT EXTRACTION    . TONSILLECTOMY    . TUBAL LIGATION      Social History   Socioeconomic History  . Marital status: Single    Spouse name: Not on file  . Number of children: Not  on file  . Years of education: Not on file  . Highest education level: Not on file  Occupational History  . Not on file  Social Needs  . Financial resource strain: Not on file  . Food insecurity    Worry: Not on file    Inability: Not on file  . Transportation needs    Medical: Not on file    Non-medical: Not on file  Tobacco Use  . Smoking status: Never Smoker  . Smokeless tobacco: Never Used  Substance and Sexual Activity  . Alcohol use: No    Alcohol/week: 0.0 standard drinks  . Drug use: No  . Sexual activity: Not Currently    Birth control/protection: Post-menopausal  Lifestyle  . Physical activity    Days per week: 0 days    Minutes per session: 0 min  . Stress: Not at all  Relationships  . Social Herbalist on phone: Not on file    Gets together: Not on file    Attends religious service: Not on file    Active member  of club or organization: Not on file    Attends meetings of clubs or organizations: Not on file    Relationship status: Not on file  . Intimate partner violence    Fear of current or ex partner: No    Emotionally abused: No    Physically abused: No    Forced sexual activity: No  Other Topics Concern  . Not on file  Social History Narrative  . Not on file    Current Outpatient Medications on File Prior to Visit  Medication Sig Dispense Refill  . atorvastatin (LIPITOR) 10 MG tablet Take 10 mg daily by mouth.    . fexofenadine-pseudoephedrine (ALLEGRA-D) 60-120 MG 12 hr tablet Take 1 tablet by mouth 2 (two) times daily. 20 tablet 0  . glimepiride (AMARYL) 2 MG tablet   3  . JANUVIA 100 MG tablet   5  . Loratadine-Pseudoephedrine (CLARITIN-D 24 HOUR PO) Take by mouth.    . losartan (COZAAR) 100 MG tablet Take by mouth.     No current facility-administered medications on file prior to visit.     Allergies  Allergen Reactions  . Metformin Diarrhea    Other reaction(s): Other (See Comments) Elevated heart rate  . Sulfa Antibiotics  Rash      Review of Systems ROS Review of Systems - General ROS: negative for - chills, fatigue, fever,  weight gain or weight loss. Positive for hot flashes, night sweats (mild to moderate), Psychological ROS: negative for - anxiety, decreased libido, depression, mood swings, physical abuse or sexual abuse Ophthalmic ROS: negative for - blurry vision, eye pain or loss of vision ENT ROS: negative for - headaches, hearing change, visual changes or vocal changes Allergy and Immunology ROS: negative for - hives, itchy/watery eyes or seasonal allergies Hematological and Lymphatic ROS: negative for - bleeding problems, bruising, swollen lymph nodes or weight loss Endocrine ROS: negative for - galactorrhea, hair pattern changes, hot flashes, malaise/lethargy, mood swings, palpitations, polydipsia/polyuria, skin changes, temperature intolerance or unexpected weight changes Breast ROS: negative for - new or changing breast lumps or nipple discharge Respiratory ROS: negative for - cough or shortness of breath Cardiovascular ROS: negative for - chest pain, irregular heartbeat, palpitations or shortness of breath Gastrointestinal ROS: no abdominal pain, change in bowel habits, or black or bloody stools Genito-Urinary ROS: no dysuria, trouble voiding, or hematuria Musculoskeletal ROS: negative for - joint pain or joint stiffness Neurological ROS: negative for - bowel and bladder control changes Dermatological ROS: negative for rash and skin lesion changes   Objective:   BP 126/86   Pulse 76   Ht 5\' 2"  (1.575 m)   Wt 244 lb 12.8 oz (111 kg)   BMI 44.77 kg/m  CONSTITUTIONAL: Well-developed, well-nourished female in no acute distress. Morbid obesity PSYCHIATRIC: Normal mood and affect. Normal behavior. Normal judgment and thought content. Lane: Alert and oriented to person, place, and time. Normal muscle tone coordination. No cranial nerve deficit noted. HENT:  Normocephalic, atraumatic,  External right and left ear normal. Oropharynx is clear and moist EYES: Conjunctivae and EOM are normal. Pupils are equal, round, and reactive to light. No scleral icterus.  NECK: Normal range of motion, supple, no masses.  Normal thyroid.  SKIN: Skin is warm and dry. No rash noted. Not diaphoretic. No erythema. No pallor. CARDIOVASCULAR: Normal heart rate noted, regular rhythm, no murmur. RESPIRATORY: Clear to auscultation bilaterally. Effort and breath sounds normal, no problems with respiration noted. BREASTS: Symmetric in size. No masses, skin changes, nipple  drainage, or lymphadenopathy. ABDOMEN: Soft, normal bowel sounds, no distention noted.  No tenderness, rebound or guarding.  BLADDER: Normal PELVIC:  Bladder no bladder distension noted  Urethra: normal appearing urethra with no masses, tenderness or lesions  Vulva: normal appearing vulva with no masses, tenderness or lesions  Vagina: normal appearing vagina with mild vaginal atrophy. No  discharge, no lesions  Cervix: normal appearing cervix without discharge or lesions  Uterus: uterus is normal size, shape, consistency and nontender.  Grade 1-2 uterine prolapse noted.   Adnexa: normal adnexa in size, nontender and no masses  RV: External Exam NormaI, No Rectal Masses and Normal Sphincter tone  MUSCULOSKELETAL: Normal range of motion. No tenderness.  No cyanosis, clubbing, or edema.  2+ distal pulses. LYMPHATIC: No Axillary, Supraclavicular, or Inguinal Adenopathy.   Labs: Lab Results  Component Value Date   WBC 7.7 11/25/2015   HGB 14.5 11/25/2015   HCT 42.9 11/25/2015   MCV 84.0 11/25/2015   PLT 237 11/25/2015    Lab Results  Component Value Date   CREATININE 0.76 11/25/2015   BUN 15 11/25/2015   NA 137 11/25/2015   K 4.2 11/25/2015   CL 105 11/25/2015   CO2 25 11/25/2015    Lab Results  Component Value Date   ALT 29 09/15/2011   AST 23 09/15/2011   ALKPHOS 73 09/15/2011   BILITOT 0.2 09/15/2011    No  results found for: CHOL, HDL, LDLCALC, LDLDIRECT, TRIG, CHOLHDL  No results found for: TSH  No results found for: HGBA1C   Assessment:   Well woman exam with routine gynecological exam Obesity, Class III, BMI 40-49.9 (morbid obesity) ([place]) Vasomotor symptoms due to menopause Breast cancer screening by mammogram Incomplete uterine prolapse Essential hypertension  Diabetes Mellitus  Plan:  Pap: Not needed.  Up to date. Due in 2021.  Mammogram: Completed.  Normal results. Continue yearly screening.  Stool Guaiac Testing:  Not Indicated. Patient to be scheduled for colonoscopy later this year.  Labs: None ordered.  Usually has labs drawn by PCP each year.  Routine preventative health maintenance measures emphasized: Exercise/Diet/Weight control, Alcohol/Substance use risks, Stress Management and Safe Sex Hypertension and DM to be managed by PCP.  Menopausal vasomotor symptoms, patient continues to decline medical management at this time.  Return to [place], MD  [facility]

## 2018-11-07 NOTE — Patient Instructions (Addendum)
Health Maintenance for Postmenopausal Women Menopause is a normal process in which your ability to get pregnant comes to an end. This process happens slowly over many months or years, usually between the ages of 48 and 55. Menopause is complete when you have missed your menstrual periods for 12 months. It is important to talk with your health care provider about some of the most common conditions that affect women after menopause (postmenopausal women). These include heart disease, cancer, and bone loss (osteoporosis). Adopting a healthy lifestyle and getting preventive care can help to promote your health and wellness. The actions you take can also lower your chances of developing some of these common conditions. What should I know about menopause? During menopause, you may get a number of symptoms, such as:  Hot flashes. These can be moderate or severe.  Night sweats.  Decrease in sex drive.  Mood swings.  Headaches.  Tiredness.  Irritability.  Memory problems.  Insomnia. Choosing to treat or not to treat these symptoms is a decision that you make with your health care provider. Do I need hormone replacement therapy?  Hormone replacement therapy is effective in treating symptoms that are caused by menopause, such as hot flashes and night sweats.  Hormone replacement carries certain risks, especially as you become older. If you are thinking about using estrogen or estrogen with progestin, discuss the benefits and risks with your health care provider. What is my risk for heart disease and stroke? The risk of heart disease, heart attack, and stroke increases as you age. One of the causes may be a change in the body's hormones during menopause. This can affect how your body uses dietary fats, triglycerides, and cholesterol. Heart attack and stroke are medical emergencies. There are many things that you can do to help prevent heart disease and stroke. Watch your blood pressure  High  blood pressure causes heart disease and increases the risk of stroke. This is more likely to develop in people who have high blood pressure readings, are of African descent, or are overweight.  Have your blood pressure checked: ? Every 3-5 years if you are 18-39 years of age. ? Every year if you are 40 years old or older. Eat a healthy diet   Eat a diet that includes plenty of vegetables, fruits, low-fat dairy products, and lean protein.  Do not eat a lot of foods that are high in solid fats, added sugars, or sodium. Get regular exercise Get regular exercise. This is one of the most important things you can do for your health. Most adults should:  Try to exercise for at least 150 minutes each week. The exercise should increase your heart rate and make you sweat (moderate-intensity exercise).  Try to do strengthening exercises at least twice each week. Do these in addition to the moderate-intensity exercise.  Spend less time sitting. Even light physical activity can be beneficial. Other tips  Work with your health care provider to achieve or maintain a healthy weight.  Do not use any products that contain nicotine or tobacco, such as cigarettes, e-cigarettes, and chewing tobacco. If you need help quitting, ask your health care provider.  Know your numbers. Ask your health care provider to check your cholesterol and your blood sugar (glucose). Continue to have your blood tested as directed by your health care provider. Do I need screening for cancer? Depending on your health history and family history, you may need to have cancer screening at different stages of your life. This   may include screening for:  Breast cancer.  Cervical cancer.  Lung cancer.  Colorectal cancer. What is my risk for osteoporosis? After menopause, you may be at increased risk for osteoporosis. Osteoporosis is a condition in which bone destruction happens more quickly than new bone creation. To help prevent  osteoporosis or the bone fractures that can happen because of osteoporosis, you may take the following actions:  If you are 19-50 years old, get at least 1,000 mg of calcium and at least 600 mg of vitamin D per day.  If you are older than age 50 but younger than age 70, get at least 1,200 mg of calcium and at least 600 mg of vitamin D per day.  If you are older than age 70, get at least 1,200 mg of calcium and at least 800 mg of vitamin D per day. Smoking and drinking excessive alcohol increase the risk of osteoporosis. Eat foods that are rich in calcium and vitamin D, and do weight-bearing exercises several times each week as directed by your health care provider. How does menopause affect my mental health? Depression may occur at any age, but it is more common as you become older. Common symptoms of depression include:  Low or sad mood.  Changes in sleep patterns.  Changes in appetite or eating patterns.  Feeling an overall lack of motivation or enjoyment of activities that you previously enjoyed.  Frequent crying spells. Talk with your health care provider if you think that you are experiencing depression. General instructions See your health care provider for regular wellness exams and vaccines. This may include:  Scheduling regular health, dental, and eye exams.  Getting and maintaining your vaccines. These include: ? Influenza vaccine. Get this vaccine each year before the flu season begins. ? Pneumonia vaccine. ? Shingles vaccine. ? Tetanus, diphtheria, and pertussis (Tdap) booster vaccine. Your health care provider may also recommend other immunizations. Tell your health care provider if you have ever been abused or do not feel safe at home. Summary  Menopause is a normal process in which your ability to get pregnant comes to an end.  This condition causes hot flashes, night sweats, decreased interest in sex, mood swings, headaches, or lack of sleep.  Treatment for this  condition may include hormone replacement therapy.  Take actions to keep yourself healthy, including exercising regularly, eating a healthy diet, watching your weight, and checking your blood pressure and blood sugar levels.  Get screened for cancer and depression. Make sure that you are up to date with all your vaccines. This information is not intended to replace advice given to you by your health care provider. Make sure you discuss any questions you have with your health care provider. Document Released: 06/10/2005 Document Revised: 04/11/2018 Document Reviewed: 04/11/2018 Elsevier Patient Education  2020 Elsevier Inc.   Breast Self-Awareness Breast self-awareness is knowing how your breasts look and feel. Doing breast self-awareness is important. It allows you to catch a breast problem early while it is still small and can be treated. All women should do breast self-awareness, including women who have had breast implants. Tell your doctor if you notice a change in your breasts. What you need:  A mirror.  A well-lit room. How to do a breast self-exam A breast self-exam is one way to learn what is normal for your breasts and to check for changes. To do a breast self-exam: Look for changes  1. Take off all the clothes above your waist. 2. Stand in   front of a mirror in a room with good lighting. 3. Put your hands on your hips. 4. Push your hands down. 5. Look at your breasts and nipples in the mirror to see if one breast or nipple looks different from the other. Check to see if: ? The shape of one breast is different. ? The size of one breast is different. ? There are wrinkles, dips, and bumps in one breast and not the other. 6. Look at each breast for changes in the skin, such as: ? Redness. ? Scaly areas. 7. Look for changes in your nipples, such as: ? Liquid around the nipples. ? Bleeding. ? Dimpling. ? Redness. ? A change in where the nipples are. Feel for  changes  1. Lie on your back on the floor. 2. Feel each breast. To do this, follow these steps: ? Pick a breast to feel. ? Put the arm closest to that breast above your head. ? Use your other arm to feel the nipple area of your breast. Feel the area with the pads of your three middle fingers by making small circles with your fingers. For the first circle, press lightly. For the second circle, press harder. For the third circle, press even harder. ? Keep making circles with your fingers at the different pressures as you move down your breast. Stop when you feel your ribs. ? Move your fingers a little toward the center of your body. ? Start making circles with your fingers again, this time going up until you reach your collarbone. ? Keep making up-and-down circles until you reach your armpit. Remember to keep using the three pressures. ? Feel the other breast in the same way. 3. Sit or stand in the tub or shower. 4. With soapy water on your skin, feel each breast the same way you did in step 2 when you were lying on the floor. Write down what you find Writing down what you find can help you remember what to tell your doctor. Write down:  What is normal for each breast.  Any changes you find in each breast, including: ? The kind of changes you find. ? Whether you have pain. ? Size and location of any lumps.  When you last had your menstrual period. General tips  Check your breasts every month.  If you are breastfeeding, the best time to check your breasts is after you feed your baby or after you use a breast pump.  If you get menstrual periods, the best time to check your breasts is 5-7 days after your menstrual period is over.  With time, you will become comfortable with the self-exam, and you will begin to know if there are changes in your breasts. Contact a doctor if you:  See a change in the shape or size of your breasts or nipples.  See a change in the skin of your breast or  nipples, such as red or scaly skin.  Have fluid coming from your nipples that is not normal.  Find a lump or thick area that was not there before.  Have pain in your breasts.  Have any concerns about your breast health. Summary  Breast self-awareness includes looking for changes in your breasts, as well as feeling for changes within your breasts.  Breast self-awareness should be done in front of a mirror in a well-lit room.  You should check your breasts every month. If you get menstrual periods, the best time to check your breasts is 5-7 days   after your menstrual period is over.  Let your doctor know of any changes you see in your breasts, including changes in size, changes on the skin, pain or tenderness, or fluid from your nipples that is not normal. This information is not intended to replace advice given to you by your health care provider. Make sure you discuss any questions you have with your health care provider. Document Released: 10/05/2007 Document Revised: 12/05/2017 Document Reviewed: 12/05/2017 Elsevier Patient Education  2020 Elsevier Inc.  

## 2018-12-17 DIAGNOSIS — Z8619 Personal history of other infectious and parasitic diseases: Secondary | ICD-10-CM

## 2018-12-17 DIAGNOSIS — E7800 Pure hypercholesterolemia, unspecified: Secondary | ICD-10-CM

## 2018-12-18 ENCOUNTER — Encounter: Payer: Self-pay | Admitting: Family Medicine

## 2018-12-18 ENCOUNTER — Other Ambulatory Visit: Payer: Self-pay

## 2018-12-18 ENCOUNTER — Ambulatory Visit: Payer: Self-pay | Admitting: Family Medicine

## 2018-12-18 DIAGNOSIS — Z113 Encounter for screening for infections with a predominantly sexual mode of transmission: Secondary | ICD-10-CM

## 2018-12-18 LAB — WET PREP FOR TRICH, YEAST, CLUE
Trichomonas Exam: NEGATIVE
Yeast Exam: NEGATIVE

## 2018-12-18 NOTE — Progress Notes (Signed)
    STI clinic/screening visit  Subjective:  Patricia Sherman is a 58 y.o. female being seen today for an STI screening visit. The patient reports they do not have symptoms.  Patient has the following medical conditions:   Patient Active Problem List   Diagnosis Date Noted  . Hypertension 11/07/2018  . Incomplete uterine prolapse 03/15/2017  . Hypercholesterolemia 06/29/2016  . Pelvic pain in female 02/09/2016  . Uterine leiomyoma 02/09/2016  . Menopause present, declines hormone replacement therapy 02/09/2016  . Obesity, Class III, BMI 40-49.9 (morbid obesity) ([place]) 02/09/2016  . Vasomotor symptoms due to menopause 02/09/2016  . Diabetes mellitus without complication ([place]) 59/97/7414  . Seasonal allergies 08/21/2014  . History of syphilis 08/05/2014     Chief Complaint  Patient presents with  . SEXUALLY TRANSMITTED DISEASE    Client states that she has had syphilis in 2016 (?).  She has a + test a couple weeks ago and repeated at her PCP's office which was negative per client report.  She is here today to be retested to be sure that she doesn't currently have syphilis.  She denies symtps.   Patient reports - see HPI.  See flowsheet for further details and programmatic requirements.    The following portions of the patient's history were reviewed and updated as appropriate: allergies, current medications, past medical history, past social history, past surgical history and problem list.  Objective:  There were no vitals filed for this visit.  Physical Exam Constitutional:      Appearance: She is obese.  HENT:     Mouth/Throat:     Mouth: Mucous membranes are moist.     Pharynx: Oropharynx is clear.  Neck:     Musculoskeletal: Neck supple. No muscular tenderness.  Abdominal:     General: Abdomen is flat.     Tenderness: There is no abdominal tenderness.  Genitourinary:    General: Normal vulva.     Vagina: No vaginal discharge.  Skin:    General: Skin is warm  and dry.     Findings: No lesion.  Neurological:     Mental Status: She is alert.       Assessment and Plan:  JANAL HAAK is a 58 y.o. female presenting to the [facility] for STI screening  1. Screening examination for venereal disease  - WET PREP FOR TRICH, YEAST, CLUE - HIV/HCV [facility] - Chlamydia/Gonorrhea [facility] - Syphilis Serology,  Lab  Treat wet prep as per SO.  Wet prep negative today. Labs pending  No follow-ups on file.  Future Appointments  Date Time Provider [place]  11/08/2019  9:00 AM Rubie Maid, MD [facility] None    Hassell Done, FNP

## 2018-12-18 NOTE — Progress Notes (Signed)
In for STD screening Debera Lat, RN

## 2018-12-24 DIAGNOSIS — E1165 Type 2 diabetes mellitus with hyperglycemia: Secondary | ICD-10-CM | POA: Diagnosis not present

## 2018-12-24 DIAGNOSIS — J302 Other seasonal allergic rhinitis: Secondary | ICD-10-CM | POA: Diagnosis not present

## 2018-12-24 DIAGNOSIS — Z6841 Body Mass Index (BMI) 40.0 and over, adult: Secondary | ICD-10-CM | POA: Diagnosis not present

## 2018-12-24 DIAGNOSIS — R8290 Unspecified abnormal findings in urine: Secondary | ICD-10-CM | POA: Diagnosis not present

## 2018-12-24 DIAGNOSIS — I10 Essential (primary) hypertension: Secondary | ICD-10-CM | POA: Diagnosis not present

## 2018-12-27 LAB — HM HEPATITIS C SCREENING LAB: HM Hepatitis Screen: NEGATIVE

## 2018-12-27 LAB — HM HIV SCREENING LAB: HM HIV Screening: NEGATIVE

## 2018-12-31 ENCOUNTER — Other Ambulatory Visit: Payer: Self-pay

## 2018-12-31 ENCOUNTER — Ambulatory Visit
Admission: RE | Admit: 2018-12-31 | Discharge: 2018-12-31 | Disposition: A | Discharge status: Home / Self Care | Payer: 59 | Source: Ambulatory Visit | Attending: Internal Medicine | Admitting: Internal Medicine

## 2018-12-31 ENCOUNTER — Other Ambulatory Visit: Payer: Self-pay | Admitting: Internal Medicine

## 2018-12-31 DIAGNOSIS — M79662 Pain in left lower leg: Secondary | ICD-10-CM | POA: Diagnosis not present

## 2018-12-31 DIAGNOSIS — Z6841 Body Mass Index (BMI) 40.0 and over, adult: Secondary | ICD-10-CM | POA: Diagnosis not present

## 2018-12-31 DIAGNOSIS — I10 Essential (primary) hypertension: Secondary | ICD-10-CM | POA: Diagnosis not present

## 2018-12-31 DIAGNOSIS — E1165 Type 2 diabetes mellitus with hyperglycemia: Secondary | ICD-10-CM | POA: Diagnosis not present

## 2018-12-31 DIAGNOSIS — M25562 Pain in left knee: Secondary | ICD-10-CM

## 2018-12-31 DIAGNOSIS — M7989 Other specified soft tissue disorders: Secondary | ICD-10-CM | POA: Diagnosis not present

## 2019-05-01 DIAGNOSIS — E1165 Type 2 diabetes mellitus with hyperglycemia: Secondary | ICD-10-CM | POA: Diagnosis not present

## 2019-05-01 DIAGNOSIS — Z6841 Body Mass Index (BMI) 40.0 and over, adult: Secondary | ICD-10-CM | POA: Diagnosis not present

## 2019-05-01 DIAGNOSIS — J302 Other seasonal allergic rhinitis: Secondary | ICD-10-CM | POA: Diagnosis not present

## 2019-05-01 DIAGNOSIS — I10 Essential (primary) hypertension: Secondary | ICD-10-CM | POA: Diagnosis not present

## 2019-05-28 DIAGNOSIS — E1165 Type 2 diabetes mellitus with hyperglycemia: Secondary | ICD-10-CM | POA: Diagnosis not present

## 2019-05-28 DIAGNOSIS — I10 Essential (primary) hypertension: Secondary | ICD-10-CM | POA: Diagnosis not present

## 2019-05-28 DIAGNOSIS — J302 Other seasonal allergic rhinitis: Secondary | ICD-10-CM | POA: Diagnosis not present

## 2019-07-03 ENCOUNTER — Ambulatory Visit: Payer: Self-pay

## 2019-07-04 ENCOUNTER — Ambulatory Visit: Payer: Self-pay

## 2019-07-08 DIAGNOSIS — E119 Type 2 diabetes mellitus without complications: Secondary | ICD-10-CM | POA: Diagnosis not present

## 2019-08-12 DIAGNOSIS — R8290 Unspecified abnormal findings in urine: Secondary | ICD-10-CM | POA: Diagnosis not present

## 2019-08-12 DIAGNOSIS — R399 Unspecified symptoms and signs involving the genitourinary system: Secondary | ICD-10-CM | POA: Diagnosis not present

## 2019-08-15 ENCOUNTER — Ambulatory Visit: Payer: 59 | Admitting: Obstetrics and Gynecology

## 2019-08-15 ENCOUNTER — Other Ambulatory Visit: Payer: Self-pay

## 2019-08-15 ENCOUNTER — Encounter: Payer: Self-pay | Admitting: Obstetrics and Gynecology

## 2019-08-15 VITALS — BP 138/84 | HR 84 | Ht 62.0 in | Wt 243.0 lb

## 2019-08-15 DIAGNOSIS — N760 Acute vaginitis: Secondary | ICD-10-CM | POA: Diagnosis not present

## 2019-08-15 DIAGNOSIS — B373 Candidiasis of vulva and vagina: Secondary | ICD-10-CM

## 2019-08-15 NOTE — Progress Notes (Signed)
    GYNECOLOGY PROGRESS NOTE  Subjective:    Patient ID: Patricia Sherman, female    DOB: 01-Dec-1960, 59 y.o.   MRN: [id number]  HPI  Patient is a 60 y.o. [id number] female who presents for complaints of vaginal itching and discharge with odor.  She states that this has been ongoing for ~ 1 week. Denies any changes in her soaps or detergents.  Denies any concerns for STI exposure.  Has not tried any OTC treatments.    The following portions of the patient's history were reviewed and updated as appropriate: allergies, current medications, past family history, past medical history, past social history, past surgical history and problem list.  Review of Systems Pertinent items noted in HPI and remainder of comprehensive ROS otherwise negative.   Objective:   Blood pressure 138/84, pulse 84, height 5\' 2"  (1.575 m), weight 243 lb (110.2 kg). General appearance: alert and no distress Abdomen: soft, non-tender; bowel sounds normal; no masses,  no organomegaly Pelvic: external genitalia normal, rectovaginal septum normal.  Vagina with scant thin white discharge, no odor.  Cervix normal appearing, no lesions and no motion tenderness.  Uterus mobile, nontender, normal shape and size.  Adnexae non-palpable, nontender bilaterally.    Labs:  Microscopic wet-mount exam shows few clue cells, no hyphae, no trichomonads, few white blood cells. KOH done.    Assessment:   Vaginitis  Plan:   - Patient with vaginitis symptoms, wet prep today with few clue cells. Discussed option of treating based on symptoms, or performing vaginal culture and awaiting results. Patient desires to wait for cultures. Discussed home remedies/measures until test results return.    Rubie Maid, MD [facility]

## 2019-08-15 NOTE — Progress Notes (Signed)
Pt present due to vaginal itching and discharge with odor. Pt stated that she noticed the issues about 7 days ago.

## 2019-08-15 NOTE — Patient Instructions (Signed)
Vaginal Yeast Infection, Adult  Vaginal yeast infection is a condition that causes vaginal discharge as well as soreness, swelling, and redness (inflammation) of the vagina. This is a common condition. Some women get this infection frequently. What are the causes? This condition is caused by a change in the normal balance of the yeast (candida) and bacteria that live in the vagina. This change causes an overgrowth of yeast, which causes the inflammation. What increases the risk? The condition is more likely to develop in women who:  Take antibiotic medicines.  Have diabetes.  Take birth control pills.  Are pregnant.  Douche often.  Have a weak body defense system (immune system).  Have been taking steroid medicines for a long time.  Frequently wear tight clothing. What are the signs or symptoms? Symptoms of this condition include:  White, thick, creamy vaginal discharge.  Swelling, itching, redness, and irritation of the vagina. The lips of the vagina (vulva) may be affected as well.  Pain or a burning feeling while urinating.  Pain during sex. How is this diagnosed? This condition is diagnosed based on:  Your medical history.  A physical exam.  A pelvic exam. Your health care provider will examine a sample of your vaginal discharge under a microscope. Your health care provider may send this sample for testing to confirm the diagnosis. How is this treated? This condition is treated with medicine. Medicines may be over-the-counter or prescription. You may be told to use one or more of the following:  Medicine that is taken by mouth (orally).  Medicine that is applied as a cream (topically).  Medicine that is inserted directly into the vagina (suppository). Follow these instructions at home:  Lifestyle  Do not have sex until your health care provider approves. Tell your sex partner that you have a yeast infection. That person should go to his or her health care  provider and ask if they should also be treated.  Do not wear tight clothes, such as pantyhose or tight pants.  Wear breathable cotton underwear. General instructions  Take or apply over-the-counter and prescription medicines only as told by your health care provider.  Eat more yogurt. This may help to keep your yeast infection from returning.  Do not use tampons until your health care provider approves.  Try taking a sitz bath to help with discomfort. This is a warm water bath that is taken while you are sitting down. The water should only come up to your hips and should cover your buttocks. Do this 3-4 times per day or as told by your health care provider.  Do not douche.  If you have diabetes, keep your blood sugar levels under control.  Keep all follow-up visits as told by your health care provider. This is important. Contact a health care provider if:  You have a fever.  Your symptoms go away and then return.  Your symptoms do not get better with treatment.  Your symptoms get worse.  You have new symptoms.  You develop blisters in or around your vagina.  You have blood coming from your vagina and it is not your menstrual period.  You develop pain in your abdomen. Summary  Vaginal yeast infection is a condition that causes discharge as well as soreness, swelling, and redness (inflammation) of the vagina.  This condition is treated with medicine. Medicines may be over-the-counter or prescription.  Take or apply over-the-counter and prescription medicines only as told by your health care provider.  Do not douche.   Do not have sex or use tampons until your health care provider approves.  Contact a health care provider if your symptoms do not get better with treatment or your symptoms go away and then return. This information is not intended to replace advice given to you by your health care provider. Make sure you discuss any questions you have with your health care  provider. Document Revised: 11/16/2018 Document Reviewed: 09/04/2017 Elsevier Patient Education  2020 [employer]vier Inc.  

## 2019-08-19 ENCOUNTER — Encounter: Payer: Self-pay | Admitting: Obstetrics and Gynecology

## 2019-08-23 ENCOUNTER — Ambulatory Visit: Payer: Self-pay

## 2019-09-03 DIAGNOSIS — M1712 Unilateral primary osteoarthritis, left knee: Secondary | ICD-10-CM | POA: Diagnosis not present

## 2019-09-17 DIAGNOSIS — L708 Other acne: Secondary | ICD-10-CM | POA: Diagnosis not present

## 2019-09-17 DIAGNOSIS — D2261 Melanocytic nevi of right upper limb, including shoulder: Secondary | ICD-10-CM | POA: Diagnosis not present

## 2019-09-17 DIAGNOSIS — L249 Irritant contact dermatitis, unspecified cause: Secondary | ICD-10-CM | POA: Diagnosis not present

## 2019-09-17 DIAGNOSIS — D2262 Melanocytic nevi of left upper limb, including shoulder: Secondary | ICD-10-CM | POA: Diagnosis not present

## 2019-09-17 DIAGNOSIS — D2271 Melanocytic nevi of right lower limb, including hip: Secondary | ICD-10-CM | POA: Diagnosis not present

## 2019-09-17 DIAGNOSIS — D225 Melanocytic nevi of trunk: Secondary | ICD-10-CM | POA: Diagnosis not present

## 2019-09-17 DIAGNOSIS — L821 Other seborrheic keratosis: Secondary | ICD-10-CM | POA: Diagnosis not present

## 2019-09-17 DIAGNOSIS — D2272 Melanocytic nevi of left lower limb, including hip: Secondary | ICD-10-CM | POA: Diagnosis not present

## 2019-09-25 DIAGNOSIS — E1165 Type 2 diabetes mellitus with hyperglycemia: Secondary | ICD-10-CM | POA: Diagnosis not present

## 2019-09-25 DIAGNOSIS — J302 Other seasonal allergic rhinitis: Secondary | ICD-10-CM | POA: Diagnosis not present

## 2019-09-25 DIAGNOSIS — R8290 Unspecified abnormal findings in urine: Secondary | ICD-10-CM | POA: Diagnosis not present

## 2019-09-25 DIAGNOSIS — I10 Essential (primary) hypertension: Secondary | ICD-10-CM | POA: Diagnosis not present

## 2019-09-26 ENCOUNTER — Encounter: Payer: Self-pay | Admitting: Advanced Practice Midwife

## 2019-09-26 ENCOUNTER — Other Ambulatory Visit: Payer: Self-pay

## 2019-09-26 ENCOUNTER — Ambulatory Visit: Payer: Self-pay | Admitting: Advanced Practice Midwife

## 2019-09-26 DIAGNOSIS — Z9851 Tubal ligation status: Secondary | ICD-10-CM | POA: Insufficient documentation

## 2019-09-26 DIAGNOSIS — Z113 Encounter for screening for infections with a predominantly sexual mode of transmission: Secondary | ICD-10-CM

## 2019-09-26 LAB — WET PREP FOR TRICH, YEAST, CLUE
Trichomonas Exam: NEGATIVE
Yeast Exam: NEGATIVE

## 2019-09-26 NOTE — Progress Notes (Signed)
[facility] Department STI clinic/screening visit  Subjective:  Patricia Sherman is a 59 y.o.SWF G3P2 nonsmoker menopausal female being seen today for an STI screening visit. The patient reports they do not have symptoms.  Patient reports that they do not desire a pregnancy in the next year.   They reported they are not interested in discussing contraception today.  No LMP recorded. Patient is postmenopausal.   Patient has the following medical conditions:   Patient Active Problem List   Diagnosis Date Noted  . Hypertension 11/07/2018  . Incomplete uterine prolapse 03/15/2017  . Hypercholesterolemia 06/29/2016  . Pelvic pain in female 02/09/2016  . Uterine leiomyoma 02/09/2016  . Menopause present, declines hormone replacement therapy 02/09/2016  . Obesity, Class III, BMI 40-49.9 (morbid obesity) (HCC) 243 lb 02/09/2016  . Vasomotor symptoms due to menopause 02/09/2016  . Diabetes mellitus without complication ([place]) [id number]  . Seasonal allergies 08/21/2014  . History of syphilis 08/05/2014    Chief Complaint  Patient presents with  . SEXUALLY TRANSMITTED DISEASE    STD screening    HPI  Patient reports LMP can't remember.  Last pap 07/2019 neg.  BTL 1985.  Last sex 09/18/19 without condom.  Last ETOH 10 years ago.  Last MJ 35 years ago.  See flowsheet for further details and programmatic requirements.    The following portions of the patient's history were reviewed and updated as appropriate: allergies, current medications, past medical history, past social history, past surgical history and problem list.  Objective:  There were no vitals filed for this visit.  Physical Exam Vitals and nursing note reviewed.  Constitutional:      Appearance: Normal appearance. She is obese.  HENT:     Head: Normocephalic and atraumatic.     Mouth/Throat:     Mouth: Mucous membranes are moist.     Pharynx: Oropharynx is clear. No oropharyngeal exudate or posterior  oropharyngeal erythema.  Pulmonary:     Effort: Pulmonary effort is normal.  Abdominal:     Palpations: Abdomen is soft. There is no mass.     Tenderness: There is no abdominal tenderness. There is no rebound.     Comments: Increased adipose, poor tone, soft without tenderness  Genitourinary:    General: Normal vulva.     Exam position: Lithotomy position.     Pubic Area: No rash or pubic lice.      Labia:        Right: No rash or lesion.        Left: No rash or lesion.      Vagina: Normal. No vaginal discharge (small amt white leukorrhea, ph<4.5), erythema, bleeding or lesions.     Cervix: Normal.     Uterus: Normal.      Adnexa: Right adnexa normal and left adnexa normal.     Rectum: Normal.     Comments: Difficult to assess due to increased adipose tissue; poor vaginal tone Lymphadenopathy:     Head:     Right side of head: No preauricular or posterior auricular adenopathy.     Left side of head: No preauricular or posterior auricular adenopathy.     Cervical: No cervical adenopathy.     Upper Body:     Right upper body: No supraclavicular or axillary adenopathy.     Left upper body: No supraclavicular or axillary adenopathy.     Lower Body: No right inguinal adenopathy. No left inguinal adenopathy.  Skin:    General: Skin is warm  and dry.     Findings: No rash.  Neurological:     Mental Status: She is alert and oriented to person, place, and time.      Assessment and Plan:  Patricia Sherman is a 59 y.o. female presenting to the [facility] for STI screening  1. Screening examination for venereal disease Treat wet mount per standing orders Immunization nurse consult - WET PREP FOR [place], YEAST, CLUE - Syphilis Serology, [facility] - HIV [place] [facility] - Chlamydia/Gonorrhea [facility]     Return if symptoms worsen or fail to improve.  Future Appointments  Date Time Provider [place]  11/08/2019  9:00 AM Rubie Maid, MD  [facility] None    Herbie Saxon, CNM

## 2019-09-26 NOTE — Progress Notes (Signed)
Pt here for STD screening.Ronny Bacon, RN

## 2019-09-26 NOTE — Progress Notes (Signed)
Wet mount reviewed with provider and is negative today. No treatment needed for wet mount per standing order and per provider order. Provider orders completed.Ronny Bacon, RN

## 2019-10-02 ENCOUNTER — Other Ambulatory Visit: Payer: Self-pay | Admitting: Internal Medicine

## 2019-10-02 DIAGNOSIS — J302 Other seasonal allergic rhinitis: Secondary | ICD-10-CM | POA: Diagnosis not present

## 2019-10-02 DIAGNOSIS — Z1231 Encounter for screening mammogram for malignant neoplasm of breast: Secondary | ICD-10-CM

## 2019-10-02 DIAGNOSIS — I10 Essential (primary) hypertension: Secondary | ICD-10-CM | POA: Diagnosis not present

## 2019-10-02 DIAGNOSIS — E1165 Type 2 diabetes mellitus with hyperglycemia: Secondary | ICD-10-CM | POA: Diagnosis not present

## 2019-10-02 DIAGNOSIS — Z0000 Encounter for general adult medical examination without abnormal findings: Secondary | ICD-10-CM | POA: Diagnosis not present

## 2019-10-08 ENCOUNTER — Ambulatory Visit
Admission: RE | Admit: 2019-10-08 | Discharge: 2019-10-08 | Disposition: A | Discharge status: Home / Self Care | Payer: 59 | Source: Ambulatory Visit | Attending: Internal Medicine | Admitting: Internal Medicine

## 2019-10-08 DIAGNOSIS — Z1231 Encounter for screening mammogram for malignant neoplasm of breast: Secondary | ICD-10-CM | POA: Insufficient documentation

## 2019-11-08 ENCOUNTER — Other Ambulatory Visit: Payer: Self-pay

## 2019-11-08 ENCOUNTER — Encounter: Payer: Self-pay | Admitting: Obstetrics and Gynecology

## 2019-11-08 ENCOUNTER — Ambulatory Visit (INDEPENDENT_AMBULATORY_CARE_PROVIDER_SITE_OTHER): Payer: 59 | Admitting: Obstetrics and Gynecology

## 2019-11-08 VITALS — BP 132/83 | HR 78 | Ht 62.0 in | Wt 244.7 lb

## 2019-11-08 DIAGNOSIS — N951 Menopausal and female climacteric states: Secondary | ICD-10-CM

## 2019-11-08 DIAGNOSIS — Z01419 Encounter for gynecological examination (general) (routine) without abnormal findings: Secondary | ICD-10-CM | POA: Diagnosis not present

## 2019-11-08 DIAGNOSIS — Z1211 Encounter for screening for malignant neoplasm of colon: Secondary | ICD-10-CM | POA: Diagnosis not present

## 2019-11-08 DIAGNOSIS — E119 Type 2 diabetes mellitus without complications: Secondary | ICD-10-CM

## 2019-11-08 DIAGNOSIS — N812 Incomplete uterovaginal prolapse: Secondary | ICD-10-CM | POA: Diagnosis not present

## 2019-11-08 DIAGNOSIS — Z23 Encounter for immunization: Secondary | ICD-10-CM

## 2019-11-08 DIAGNOSIS — I10 Essential (primary) hypertension: Secondary | ICD-10-CM | POA: Diagnosis not present

## 2019-11-08 MED ORDER — TETANUS-DIPHTH-ACELL PERTUSSIS 5-2.5-18.5 LF-MCG/0.5 IM SUSP
0.5000 mL | Freq: Once | INTRAMUSCULAR | Status: AC
  Administered 2019-11-08: 0.5 mL via INTRAMUSCULAR

## 2019-11-08 NOTE — Progress Notes (Signed)
Pt present for annual exam. Pt stated that she was doing well and denies any issues at this time.  tdap administered today.

## 2019-11-08 NOTE — Progress Notes (Signed)
ANNUAL PREVENTATIVE CARE GYNECOLOGY  ENCOUNTER NOTE  Subjective:       Patricia Sherman is a 59 y.o. [phone] female here for a routine annual gynecologic exam. She has a PMH of HTN, DM, and fibroid uterus. The patient is not sexually active. The patient has never taken hormone replacement therapy. Patient denies post-menopausal vaginal bleeding. The patient wears seatbelts: no. The patient participates in regular exercise: no. Has the patient ever been transfused or tattooed?: no. The patient reports that there is not domestic violence in her life.  Current complaints: 1.  None    Gynecologic History No LMP recorded. Patient is postmenopausal. Contraception: post menopausal status Last Pap: 03/15/2017. Results were: normal Last mammogram: 10/08/2019. Results were: normal Last Colonoscopy: has not had one in some time.    Obstetric History OB History  Gravida Para Term Preterm AB Living  5 2 2   3 2   SAB TAB Ectopic Multiple Live Births    0     2    # Outcome Date GA Lbr Len/2nd Weight Sex Delivery Anes PTL Lv  5 Term 1992   7 lb 6.4 oz (3.357 kg) F Vag-Spont   LIV  4 Term 1985   6 lb 11.2 oz (3.039 kg) F Vag-Spont   LIV  3 AB           2 AB           1 AB             Past Medical History:  Diagnosis Date  . Diabetes mellitus without complication ([place])   . Environmental allergies   . Hypertension   . Uterine leiomyoma 02/09/2016    Family History  Problem Relation Age of Onset  . Heart disease Mother   . Hypertension Mother   . Diabetes Sister   . Hypertension Sister   . Heart disease Brother   . Hypertension Brother   . Diabetes Daughter   . Breast cancer Neg Hx   . Ovarian cancer Neg Hx   . Colon cancer Neg Hx     Past Surgical History:  Procedure Laterality Date  . CATARACT EXTRACTION    . TONSILLECTOMY    . TUBAL LIGATION      Social History   Socioeconomic History  . Marital status: Single    Spouse name: Not on file  . Number of children: Not  on file  . Years of education: Not on file  . Highest education level: Not on file  Occupational History  . Not on file  Tobacco Use  . Smoking status: Never Smoker  . Smokeless tobacco: Never Used  Vaping Use  . Vaping Use: Never used  Substance and Sexual Activity  . Alcohol use: No    Alcohol/week: 0.0 standard drinks  . Drug use: No  . Sexual activity: Not Currently    Birth control/protection: Post-menopausal  Other Topics Concern  . Not on file  Social History Narrative  . Not on file   Social Determinants of Health   Financial Resource Strain:   . Difficulty of Paying Living Expenses:   Food Insecurity:   . Worried About Charity fundraiser in the Last Year:   . Arboriculturist in the Last Year:   Transportation Needs:   . Film/video editor (Medical):   Marland Kitchen Lack of Transportation (Non-Medical):   Physical Activity:   . Days of Exercise per Week:   . Minutes of Exercise  per Session:   Stress:   . Feeling of Stress :   Social Connections:   . Frequency of Communication with Friends and Family:   . Frequency of Social Gatherings with Friends and Family:   . Attends Religious Services:   . Active Member of Clubs or Organizations:   . Attends Archivist Meetings:   Marland Kitchen Marital Status:   Intimate Partner Violence:   . Fear of Current or Ex-Partner:   . Emotionally Abused:   Marland Kitchen Physically Abused:   . Sexually Abused:     Current Outpatient Medications on File Prior to Visit  Medication Sig Dispense Refill  . atorvastatin (LIPITOR) 10 MG tablet Take 10 mg daily by mouth.    . fexofenadine-pseudoephedrine (ALLEGRA-D) 60-120 MG 12 hr tablet Take 1 tablet by mouth 2 (two) times daily. 20 tablet 0  . glimepiride (AMARYL) 2 MG tablet   3  . JANUVIA 100 MG tablet   5  . loratadine (CLARITIN) 10 MG tablet Take 10 mg by mouth daily.    Marland Kitchen losartan (COZAAR) 25 MG tablet Take 25 mg by mouth daily.    . Multiple Vitamin (MULTIVITAMIN) tablet Take 1 tablet by  mouth daily.     No current facility-administered medications on file prior to visit.    Allergies  Allergen Reactions  . Metformin Diarrhea    Other reaction(s): Other (See Comments) Elevated heart rate  . Sulfa Antibiotics Rash      Review of Systems ROS Review of Systems - General ROS: negative for - chills, fatigue, fever,  weight gain or weight loss. Positive for hot flashes, night sweats (mild), Psychological ROS: negative for - anxiety, decreased libido, depression, mood swings, physical abuse or sexual abuse Ophthalmic ROS: negative for - blurry vision, eye pain or loss of vision ENT ROS: negative for - headaches, hearing change, visual changes or vocal changes Allergy and Immunology ROS: negative for - hives, itchy/watery eyes or seasonal allergies Hematological and Lymphatic ROS: negative for - bleeding problems, bruising, swollen lymph nodes or weight loss Endocrine ROS: negative for - galactorrhea, hair pattern changes, hot flashes, malaise/lethargy, mood swings, palpitations, polydipsia/polyuria, skin changes, temperature intolerance or unexpected weight changes Breast ROS: negative for - new or changing breast lumps or nipple discharge Respiratory ROS: negative for - cough or shortness of breath Cardiovascular ROS: negative for - chest pain, irregular heartbeat, palpitations or shortness of breath Gastrointestinal ROS: no abdominal pain, change in bowel habits, or black or bloody stools Genito-Urinary ROS: no dysuria, trouble voiding, or hematuria Musculoskeletal ROS: negative for - joint pain or joint stiffness Neurological ROS: negative for - bowel and bladder control changes Dermatological ROS: negative for rash and skin lesion changes   Objective:   BP 132/83   Pulse 78   Ht 5\' 2"  (1.575 m)   Wt 244 lb 11.2 oz (111 kg)   BMI 44.76 kg/m  CONSTITUTIONAL: Well-developed, well-nourished female in no acute distress. Morbid obesity PSYCHIATRIC: Normal mood and  affect. Normal behavior. Normal judgment and thought content. [place]: Alert and oriented to person, place, and time. Normal muscle tone coordination. No cranial nerve deficit noted. HENT:  Normocephalic, atraumatic, External right and left ear normal. Oropharynx is clear and moist EYES: Conjunctivae and EOM are normal. Pupils are equal, round, and reactive to light. No scleral icterus.  NECK: Normal range of motion, supple, no masses.  Normal thyroid.  SKIN: Skin is warm and dry. No rash noted. Not diaphoretic. No erythema. No pallor. CARDIOVASCULAR:  Normal heart rate noted, regular rhythm, no murmur. RESPIRATORY: Clear to auscultation bilaterally. Effort and breath sounds normal, no problems with respiration noted. BREASTS: Symmetric in size. No masses, skin changes, nipple drainage, or lymphadenopathy. ABDOMEN: Soft, normal bowel sounds, no distention noted.  No tenderness, rebound or guarding.  BLADDER: Normal PELVIC:  Bladder no bladder distension noted  Urethra: normal appearing urethra with no masses, tenderness or lesions  Vulva: normal appearing vulva with no masses, tenderness or lesions  Vagina: normal appearing vagina with mild vaginal atrophy. No  discharge, no lesions  Cervix: normal appearing cervix without discharge or lesions  Uterus: uterus is normal size, shape, consistency and nontender.  Grade 2-3 uterine prolapse noted.   Adnexa: normal adnexa in size, nontender and no masses  RV: External Exam NormaI, No Rectal Masses and Normal Sphincter tone  MUSCULOSKELETAL: Normal range of motion. No tenderness.  No cyanosis, clubbing, or edema.  2+ distal pulses. LYMPHATIC: No Axillary, Supraclavicular, or Inguinal Adenopathy.   Labs:  Reviewed in Care Everywhere, performed 09/25/2019 by PCP.   Assessment:   Well woman exam with routine gynecological exam Obesity, Class III, BMI 40-49.9 (morbid obesity) ([place]) Vasomotor symptoms due to menopause Breast cancer screening by  mammogram Incomplete uterine prolapse Essential hypertension  Diabetes Mellitus  Plan:  Pap: Not needed.  Up to date. Due in 03/2020. Will perform next year.  Mammogram: Completed.  Normal results. Continue yearly screening.  Stool Guaiac Testing: Still has not done a colonoscopy.  Offered Cologuard to patient as she is low risk. Patient ok to perform. Will order.   Labs: None ordered.  Usually has labs drawn by PCP each year.  Routine preventative health maintenance measures emphasized: Exercise/Diet/Weight control, Alcohol/Substance use risks, Stress Management and Safe Sex Hypertension and DM to be managed by PCP. Last A1c was 9.6.  Menopausal vasomotor symptoms, patient continues to decline medical management at this time.  Tdap given today.  Has completed COVID vaccine series . Return to Jane, MD  [facility]

## 2019-11-08 NOTE — Patient Instructions (Addendum)
Preventive Care 11-60 Years Old, Female Preventive care refers to visits with your health care provider and lifestyle choices that can promote health and wellness. This includes:  A yearly physical exam. This may also be called an annual well check.  Regular dental visits and eye exams.  Immunizations.  Screening for certain conditions.  Healthy lifestyle choices, such as eating a healthy diet, getting regular exercise, not using drugs or products that contain nicotine and tobacco, and limiting alcohol use. What can I expect for my preventive care visit? Physical exam Your health care provider will check your:  Height and weight. This may be used to calculate body mass index (BMI), which tells if you are at a healthy weight.  Heart rate and blood pressure.  Skin for abnormal spots. Counseling Your health care provider may ask you questions about your:  Alcohol, tobacco, and drug use.  Emotional well-being.  Home and relationship well-being.  Sexual activity.  Eating habits.  Work and work Statistician.  Method of birth control.  Menstrual cycle.  Pregnancy history. What immunizations do I need?  Influenza (flu) vaccine  This is recommended every year. Tetanus, diphtheria, and pertussis (Tdap) vaccine  You may need a Td booster every 10 years. Varicella (chickenpox) vaccine  You may need this if you have not been vaccinated. Zoster (shingles) vaccine  You may need this after age 27. Measles, mumps, and rubella (MMR) vaccine  You may need at least one dose of MMR if you were born in 1957 or later. You may also need a second dose. Pneumococcal conjugate (PCV13) vaccine  You may need this if you have certain conditions and were not previously vaccinated. Pneumococcal polysaccharide (PPSV23) vaccine  You may need one or two doses if you smoke cigarettes or if you have certain conditions. Meningococcal conjugate (MenACWY) vaccine  You may need this if you  have certain conditions. Hepatitis A vaccine  You may need this if you have certain conditions or if you travel or work in places where you may be exposed to hepatitis A. Hepatitis B vaccine  You may need this if you have certain conditions or if you travel or work in places where you may be exposed to hepatitis B. Haemophilus influenzae type b (Hib) vaccine  You may need this if you have certain conditions. Human papillomavirus (HPV) vaccine  If recommended by your health care provider, you may need three doses over 6 months. You may receive vaccines as individual doses or as more than one vaccine together in one shot (combination vaccines). Talk with your health care provider about the risks and benefits of combination vaccines. What tests do I need? Blood tests  Lipid and cholesterol levels. These may be checked every 5 years, or more frequently if you are over 7 years old.  Hepatitis C test.  Hepatitis B test. Screening  Lung cancer screening. You may have this screening every year starting at age 58 if you have a 30-pack-year history of smoking and currently smoke or have quit within the past 15 years.  Colorectal cancer screening. All adults should have this screening starting at age 63 and continuing until age 95. Your health care provider may recommend screening at age 78 if you are at increased risk. You will have tests every 1-10 years, depending on your results and the type of screening test.  Diabetes screening. This is done by checking your blood sugar (glucose) after you have not eaten for a while (fasting). You may have this  done every 1-3 years.  Mammogram. This may be done every 1-2 years. Talk with your health care provider about when you should start having regular mammograms. This may depend on whether you have a family history of breast cancer.  BRCA-related cancer screening. This may be done if you have a family history of breast, ovarian, tubal, or peritoneal  cancers.  Pelvic exam and Pap test. This may be done every 3 years starting at age 20. Starting at age 33, this may be done every 5 years if you have a Pap test in combination with an HPV test. Other tests  Sexually transmitted disease (STD) testing.  Bone density scan. This is done to screen for osteoporosis. You may have this scan if you are at high risk for osteoporosis. Follow these instructions at home: Eating and drinking  Eat a diet that includes fresh fruits and vegetables, whole grains, lean protein, and low-fat dairy.  Take vitamin and mineral supplements as recommended by your health care provider.  Do not drink alcohol if: ? Your health care provider tells you not to drink. ? You are pregnant, may be pregnant, or are planning to become pregnant.  If you drink alcohol: ? Limit how much you have to 0-1 drink a day. ? Be aware of how much alcohol is in your drink. In the [place] one drink equals one 12 oz bottle of beer (355 mL), one 5 oz glass of wine (148 mL), or one 1 oz glass of hard liquor (44 mL). Lifestyle  Take daily care of your teeth and gums.  Stay active. Exercise for at least 30 minutes on 5 or more days each week.  Do not use any products that contain nicotine or tobacco, such as cigarettes, e-cigarettes, and chewing tobacco. If you need help quitting, ask your health care provider.  If you are sexually active, practice safe sex. Use a condom or other form of birth control (contraception) in order to prevent pregnancy and STIs (sexually transmitted infections).  If told by your health care provider, take low-dose aspirin daily starting at age 38. What's next?  Visit your health care provider once a year for a well check visit.  Ask your health care provider how often you should have your eyes and teeth checked.  Stay up to date on all vaccines. This information is not intended to replace advice given to you by your health care provider. Make sure you  discuss any questions you have with your health care provider. Document Revised: 12/28/2017 Document Reviewed: 12/28/2017 Elsevier Patient Education  2020 [place] Breast self-awareness is knowing how your breasts look and feel. Doing breast self-awareness is important. It allows you to catch a breast problem early while it is still small and can be treated. All women should do breast self-awareness, including women who have had breast implants. Tell your doctor if you notice a change in your breasts. What you need:  A mirror.  A well-lit room. How to do a breast self-exam A breast self-exam is one way to learn what is normal for your breasts and to check for changes. To do a breast self-exam: Look for changes  1. Take off all the clothes above your waist. 2. Stand in front of a mirror in a room with good lighting. 3. Put your hands on your hips. 4. Push your hands down. 5. Look at your breasts and nipples in the mirror to see if one breast or nipple looks  different from the other. Check to see if: ? The shape of one breast is different. ? The size of one breast is different. ? There are wrinkles, dips, and bumps in one breast and not the other. 6. Look at each breast for changes in the skin, such as: ? Redness. ? Scaly areas. 7. Look for changes in your nipples, such as: ? Liquid around the nipples. ? Bleeding. ? Dimpling. ? Redness. ? A change in where the nipples are. Feel for changes  1. Lie on your back on the floor. 2. Feel each breast. To do this, follow these steps: ? Pick a breast to feel. ? Put the arm closest to that breast above your head. ? Use your other arm to feel the nipple area of your breast. Feel the area with the pads of your three middle fingers by making small circles with your fingers. For the first circle, press lightly. For the second circle, press harder. For the third circle, press even harder. ? Keep making circles  with your fingers at the different pressures as you move down your breast. Stop when you feel your ribs. ? Move your fingers a little toward the center of your body. ? Start making circles with your fingers again, this time going up until you reach your collarbone. ? Keep making up-and-down circles until you reach your armpit. Remember to keep using the three pressures. ? Feel the other breast in the same way. 3. Sit or stand in the tub or shower. 4. With soapy water on your skin, feel each breast the same way you did in step 2 when you were lying on the floor. Write down what you find Writing down what you find can help you remember what to tell your doctor. Write down:  What is normal for each breast.  Any changes you find in each breast, including: ? The kind of changes you find. ? Whether you have pain. ? Size and location of any lumps.  When you last had your menstrual period. General tips  Check your breasts every month.  If you are breastfeeding, the best time to check your breasts is after you feed your baby or after you use a breast pump.  If you get menstrual periods, the best time to check your breasts is 5-7 days after your menstrual period is over.  With time, you will become comfortable with the self-exam, and you will begin to know if there are changes in your breasts. Contact a doctor if you:  See a change in the shape or size of your breasts or nipples.  See a change in the skin of your breast or nipples, such as red or scaly skin.  Have fluid coming from your nipples that is not normal.  Find a lump or thick area that was not there before.  Have pain in your breasts.  Have any concerns about your breast health. Summary  Breast self-awareness includes looking for changes in your breasts, as well as feeling for changes within your breasts.  Breast self-awareness should be done in front of a mirror in a well-lit room.  You should check your breasts every  month. If you get menstrual periods, the best time to check your breasts is 5-7 days after your menstrual period is over.  Let your doctor know of any changes you see in your breasts, including changes in size, changes on the skin, pain or tenderness, or fluid from your nipples that is not normal. This  intended to replace advice given to you by your health care provider. Make sure you discuss any questions you have with your health care provider. Document Revised: 12/05/2017 Document Reviewed: 12/05/2017 Elsevier Patient Education  2020 Elsevier Inc.  

## 2019-12-11 ENCOUNTER — Other Ambulatory Visit: Payer: Self-pay

## 2019-12-11 ENCOUNTER — Ambulatory Visit: Payer: 59 | Admitting: Obstetrics and Gynecology

## 2019-12-11 ENCOUNTER — Other Ambulatory Visit (HOSPITAL_COMMUNITY)
Admission: RE | Admit: 2019-12-11 | Discharge: 2019-12-11 | Disposition: A | Discharge status: Home / Self Care | Payer: 59 | Source: Ambulatory Visit | Attending: Obstetrics and Gynecology | Admitting: Obstetrics and Gynecology

## 2019-12-11 ENCOUNTER — Encounter: Payer: Self-pay | Admitting: Obstetrics and Gynecology

## 2019-12-11 VITALS — BP 155/90 | HR 91 | Ht 62.0 in | Wt 248.6 lb

## 2019-12-11 DIAGNOSIS — N898 Other specified noninflammatory disorders of vagina: Secondary | ICD-10-CM | POA: Diagnosis not present

## 2019-12-11 DIAGNOSIS — R102 Pelvic and perineal pain: Secondary | ICD-10-CM | POA: Diagnosis not present

## 2019-12-11 DIAGNOSIS — M545 Low back pain, unspecified: Secondary | ICD-10-CM

## 2019-12-11 DIAGNOSIS — B3731 Acute candidiasis of vulva and vagina: Secondary | ICD-10-CM

## 2019-12-11 DIAGNOSIS — N813 Complete uterovaginal prolapse: Secondary | ICD-10-CM | POA: Diagnosis not present

## 2019-12-11 DIAGNOSIS — B373 Candidiasis of vulva and vagina: Secondary | ICD-10-CM | POA: Diagnosis not present

## 2019-12-11 LAB — POCT URINALYSIS DIPSTICK
Bilirubin, UA: NEGATIVE
Blood, UA: NEGATIVE
Glucose, UA: POSITIVE — AB
Ketones, UA: NEGATIVE
Leukocytes, UA: NEGATIVE
Nitrite, UA: NEGATIVE
Protein, UA: NEGATIVE
Spec Grav, UA: 1.025 (ref 1.010–1.025)
Urobilinogen, UA: 0.2 E.U./dL
pH, UA: 6 (ref 5.0–8.0)

## 2019-12-11 NOTE — Progress Notes (Signed)
    GYNECOLOGY PROGRESS NOTE  Subjective:    Patient ID: Patricia Sherman, female    DOB: 30-Mar-1961, 59 y.o.   MRN: [id number]  HPI  Patient is a 59 y.o. [id number] female who presents for complaints of vaginal discharge with odor for the past 3 days.  States that the discharge was yellow-green, and there was associated itching.  She is also complaining of low back pain and lower abdominal pelvic pain.  She denies fevers or chills, also denies any urinary symptoms.  The following portions of the patient's history were reviewed and updated as appropriate: allergies, current medications, past family history, past medical history, past social history, past surgical history and problem list.  Review of Systems Pertinent items noted in HPI and remainder of comprehensive ROS otherwise negative.   Objective:   Blood pressure (!) 155/90, pulse 91, height 5\' 2"  (1.575 m), weight 248 lb 9.6 oz (112.8 kg). General appearance: alert and no distress Abdomen: soft, non-tender; bowel sounds normal; no masses,  no organomegaly Pelvic: external genitalia normal, rectovaginal septum normal.  Vagina with minimal discharge noted.  Cervix normal appearing, no lesions and no motion tenderness.  Uterus mobile, nontender, normal shape and size, however descensus present (Grade 2-3 uterine prolapse).  Adnexae non-palpable, nontender bilaterally.     Labs:  Results for orders placed or performed in visit on 12/11/19  POCT urinalysis dipstick  Result Value Ref Range   Color, UA yellow    Clarity, UA clear    Glucose, UA Positive (A) Negative   Bilirubin, UA neg    Ketones, UA neg    Spec Grav, UA 1.025 1.010 - 1.025   Blood, UA neg    pH, UA 6.0 5.0 - 8.0   Protein, UA Negative Negative   Urobilinogen, UA 0.2 0.2 or 1.0 E.U./dL   Nitrite, UA neg    Leukocytes, UA Negative Negative   Appearance yellow;clear    Odor      Assessment:   1. Acute midline low back pain without sciatica   2. Acute pelvic  pain, female   3. Third degree uterine prolapse   4. Vaginal discharge     Plan:   1.  Low back pain and pelvic pain noted in the setting of prolapse and vaginal discharge.  Unclear if pain could be due to the vaginal discharge or due to the new onset prolapse (as patient was noted to have a normal pelvic exam back in April of this year).  Very scant discharge noted on today's exam, not enough to adequately perform a wet prep, so a vaginal culture was performed.  UA was negative today.  Will notify patient of results through [facility]. 2.  Discussed possibility of back pain and pelvic pain being related to her uterine prolapse.  Advised on different management options including use of a pessary or surgical intervention with hysterectomy.  Reviewed.  Not on options.  Patient desires to have some time to think about these options.  Will schedule next appointment in the next several weeks for further discussion of her options.   A total of 15 minutes were spent face-to-face with the patient during this encounter and over half of that time dealt with counseling and coordination of care.   Rubie Maid, MD [facility]

## 2019-12-11 NOTE — Progress Notes (Signed)
Pt present for vaginal discharge with odor. Pt stated she noticed the vaginal discharge and odor on Monday of this week. Pt stated that the discharge was yellowish/green with odor, also having vaginal itching and lower back pains.

## 2019-12-13 ENCOUNTER — Other Ambulatory Visit: Payer: Self-pay | Admitting: Obstetrics and Gynecology

## 2019-12-13 LAB — CERVICOVAGINAL ANCILLARY ONLY
Bacterial Vaginitis (gardnerella): NEGATIVE
Candida Glabrata: POSITIVE — AB
Candida Vaginitis: POSITIVE — AB
Comment: NEGATIVE
Comment: NEGATIVE
Comment: NEGATIVE

## 2019-12-13 MED ORDER — FLUCONAZOLE 150 MG PO TABS: 150.0000 mg | ORAL_TABLET | Freq: Once | ORAL | 3 refills | Status: DC

## 2019-12-13 NOTE — Addendum Note (Signed)
Addended by: Augusto Gamble on: 12/13/2019 03:01 PM   Modules accepted: Orders

## 2019-12-30 ENCOUNTER — Other Ambulatory Visit: Payer: 59

## 2020-01-24 ENCOUNTER — Other Ambulatory Visit: Payer: Self-pay | Admitting: Internal Medicine

## 2020-01-28 DIAGNOSIS — E1165 Type 2 diabetes mellitus with hyperglycemia: Secondary | ICD-10-CM | POA: Diagnosis not present

## 2020-01-28 DIAGNOSIS — J302 Other seasonal allergic rhinitis: Secondary | ICD-10-CM | POA: Diagnosis not present

## 2020-01-28 DIAGNOSIS — Z0000 Encounter for general adult medical examination without abnormal findings: Secondary | ICD-10-CM | POA: Diagnosis not present

## 2020-01-28 DIAGNOSIS — I10 Essential (primary) hypertension: Secondary | ICD-10-CM | POA: Diagnosis not present

## 2020-02-04 ENCOUNTER — Other Ambulatory Visit: Payer: Self-pay | Admitting: Internal Medicine

## 2020-02-04 DIAGNOSIS — Z23 Encounter for immunization: Secondary | ICD-10-CM | POA: Diagnosis not present

## 2020-02-04 DIAGNOSIS — E1165 Type 2 diabetes mellitus with hyperglycemia: Secondary | ICD-10-CM | POA: Diagnosis not present

## 2020-02-04 DIAGNOSIS — I10 Essential (primary) hypertension: Secondary | ICD-10-CM | POA: Diagnosis not present

## 2020-02-04 DIAGNOSIS — J302 Other seasonal allergic rhinitis: Secondary | ICD-10-CM | POA: Diagnosis not present

## 2020-03-17 NOTE — Progress Notes (Signed)
Pt present for vaginal discharge with odor. Pt c/o of vaginal discharge with odor x 1 week.

## 2020-03-18 ENCOUNTER — Other Ambulatory Visit: Payer: Self-pay | Admitting: Obstetrics and Gynecology

## 2020-03-18 ENCOUNTER — Other Ambulatory Visit: Payer: Self-pay

## 2020-03-18 ENCOUNTER — Ambulatory Visit: Payer: 59 | Admitting: Obstetrics and Gynecology

## 2020-03-18 ENCOUNTER — Encounter: Payer: Self-pay | Admitting: Obstetrics and Gynecology

## 2020-03-18 ENCOUNTER — Telehealth: Payer: Self-pay

## 2020-03-18 VITALS — BP 137/82 | HR 89 | Ht 62.0 in | Wt 247.7 lb

## 2020-03-18 DIAGNOSIS — N812 Incomplete uterovaginal prolapse: Secondary | ICD-10-CM | POA: Diagnosis not present

## 2020-03-18 DIAGNOSIS — K1321 Leukoplakia of oral mucosa, including tongue: Secondary | ICD-10-CM

## 2020-03-18 DIAGNOSIS — B9689 Other specified bacterial agents as the cause of diseases classified elsewhere: Secondary | ICD-10-CM | POA: Diagnosis not present

## 2020-03-18 DIAGNOSIS — R1032 Left lower quadrant pain: Secondary | ICD-10-CM

## 2020-03-18 DIAGNOSIS — N760 Acute vaginitis: Secondary | ICD-10-CM | POA: Diagnosis not present

## 2020-03-18 MED ORDER — FLUCONAZOLE 150 MG PO TABS: 150.0000 mg | ORAL_TABLET | Freq: Once | ORAL | 0 refills | Status: DC

## 2020-03-18 MED ORDER — SOLOSEC 2 G PO PACK: 1.0000 | PACK | Freq: Once | ORAL | 0 refills | Status: AC

## 2020-03-18 NOTE — Progress Notes (Signed)
    GYNECOLOGY PROGRESS NOTE  Subjective:    Patient ID: Patricia Sherman, female    DOB: [birth year], 59 y.o.   MRN: [id number]  HPI  Patient is a 59 y.o. [id number] female who presents for complaints of clear/white vaginal discharge that has been ongoing x 1 week. The discharge does have an odor associated.  She is allso noting pain on left side near ovary.   Additionally, Patricia Sherman reports small bumps on tongue and white streaks on tongue. This has been ongoing for several weeks. Denies sore throat, cough, fevers/chills.     The following portions of the patient's history were reviewed and updated as appropriate: allergies, current medications, past family history, past medical history, past social history, past surgical history and problem list.  Review of Systems Pertinent items noted in HPI and remainder of comprehensive ROS otherwise negative.   Objective:   Blood pressure 137/82, pulse 89, height 5\' 2"  (1.575 m), weight 247 lb 11.2 oz (112.4 kg). General appearance: alert and no distress  Throat: small streaks of leukoplakia noted, several small papilllary bumps present. Tonsils normal, no ereythema. Dentition overall normal.  Abdomen: normal findings: no masses palpable, no organomegaly and soft and abnormal findings:  mild tenderness in the LLQ to deep palpation. Pelvic: external genitalia normal, rectovaginal septum normal.  Vagina with scant thin white discharge.  Cervix normal appearing, no lesions and no motion tenderness.  Uterus mobile, nontender, normal shape and size, with descent to the vaginal hymen (Grade 3 prolapse).  Adnexae non-palpable, nontender bilaterally.  Extremities: Nontender, no edema present.  Microscopic wet-mount exam shows moderate clue cells, few hyphae, no trichomonads, no white blood cells. KOH done.    Assessment:   1. Bacterial vaginosis   2. LLQ pain   3. Leukoplakia, tongue   4. Incomplete uterine prolapse    Plan:   1.  Bacterial vaginosis  -discussion her on treatment options, patient okay to use Solosec.  Prescription given and savings card information given as well.  Patient aware that if cost is prohibitive can call the office back for alternative method of treatment. 2.  Left lower quadrant pain -unclear cause at this time, however differential diagnosis includes current vaginal infection, pelvic organ prolapse, or adnexal mass (although no mass palpable on exam today).  Patient has not taken anything for pain.  If pain persists after treatment of infection can consider further work-up with ultrasound if it continues to worsen or fails to improve.  Would advise to take OTC medications for pain as needed.  Also ensure the bowel movements are regular. 3.  Leukoplakia of the tongue -unclear cause.  However patient does have a history of diabetes so cannot completely rule out thrush (although atypical appearance of thrush) and patient does have history of diabetes which is a known risk factor.  Will prescribe 1 dose of Diflucan to take orally and also encourage use of hydrogen peroxide saline mix to gargle with for the next few days.  If no improvement can prescribe swish and swallow solution. 4.  Incomplete uterine prolapse -briefly discussed management options including use of pessary versus surgical intervention if symptoms become bothersome or if pelvic pain continues.  Currently does not desire any management but will think about her options.  Patient to return to clinic for symptoms that persist or fail to improve.  Otherwise to return for any routine gynecologic appointments as scheduled.   Rubie Maid, MD [facility]

## 2020-03-18 NOTE — Telephone Encounter (Signed)
Pt called in and stated that she was seen today and that the doctor sent in a prescription for Solosec into the pharmacy. The pharmacy is requesting a PA and the pt says not to worry about it because the  is too expensive. The pt is requesting that a prescription for the cream be sent to [facility] pharmacy instead. I told the pt I will send a message to the nurse. The pt verbally understood. Please advise

## 2020-03-18 NOTE — Patient Instructions (Signed)

## 2020-03-19 ENCOUNTER — Other Ambulatory Visit: Payer: Self-pay | Admitting: Obstetrics and Gynecology

## 2020-03-19 ENCOUNTER — Telehealth: Payer: Self-pay

## 2020-03-19 ENCOUNTER — Other Ambulatory Visit: Payer: Self-pay

## 2020-03-19 MED ORDER — METRONIDAZOLE 0.75 % EX GEL: CUTANEOUS | 0 refills | Status: DC

## 2020-03-19 MED ORDER — METRONIDAZOLE 0.75 % VA GEL: 1.0000 | Freq: Every day | VAGINAL | 0 refills | Status: DC

## 2020-03-19 NOTE — Telephone Encounter (Signed)
Patients pharmacy called in stating that the gel that was called in is for facial use not vaginal, they said that the patient stated that it should be a vaginal gel. Could you please advise?

## 2020-03-19 NOTE — Telephone Encounter (Signed)
Sent in correct rx 

## 2020-03-19 NOTE — Telephone Encounter (Signed)
Pt called no answer LM via VM that Metrogel was sent to her pharmacy [facility] apply daily at night for 5 days. Pt was advised via vm if she had any questions or concerns to please contact the office.

## 2020-04-03 ENCOUNTER — Other Ambulatory Visit: Payer: Self-pay

## 2020-04-03 ENCOUNTER — Ambulatory Visit: Payer: Self-pay | Admitting: Family Medicine

## 2020-04-03 ENCOUNTER — Encounter: Payer: Self-pay | Admitting: Family Medicine

## 2020-04-03 DIAGNOSIS — B379 Candidiasis, unspecified: Secondary | ICD-10-CM

## 2020-04-03 DIAGNOSIS — M7670 Peroneal tendinitis, unspecified leg: Secondary | ICD-10-CM | POA: Insufficient documentation

## 2020-04-03 DIAGNOSIS — Z113 Encounter for screening for infections with a predominantly sexual mode of transmission: Secondary | ICD-10-CM

## 2020-04-03 LAB — WET PREP FOR TRICH, YEAST, CLUE
Trichomonas Exam: NEGATIVE
Yeast Exam: NEGATIVE

## 2020-04-03 MED ORDER — FLUCONAZOLE 150 MG PO TABS: 150.0000 mg | ORAL_TABLET | Freq: Once | ORAL | 0 refills | Status: AC

## 2020-04-03 NOTE — Progress Notes (Signed)
[facility] Department STI clinic/screening visit  Subjective:  Patricia Sherman is a 59 y.o. female being seen today for No chief complaint on file.    The patient reports they do have symptoms. Patient reports that they do not desire a pregnancy in the next year. They reported they are not interested in discussing contraception today.   Patient has the following medical conditions:   Patient Active Problem List   Diagnosis Date Noted  . History of bilateral tubal ligation 1985 09/26/2019  . Hypertension 11/07/2018  . Incomplete uterine prolapse 03/15/2017  . Hypercholesterolemia 06/29/2016  . Pelvic pain in female 02/09/2016  . Uterine leiomyoma 02/09/2016  . Menopause present, declines hormone replacement therapy 02/09/2016  . Obesity, Class III, BMI 40-49.9 (morbid obesity) (HCC) 243 lb 02/09/2016  . Vasomotor symptoms due to menopause 02/09/2016  . Diabetes mellitus without complication ([place]) 67/34/1937  . Seasonal allergies 08/21/2014  . History of syphilis 08/05/2014    HPI  Pt reports she has had vaginal discharge w/foul odor x a couple weeks. Went to doctor 1 wk ago, given rx for metronidazole cream x5 days, followed by fluconazole pill. This treatment resolved the discharge but it is coming back. Also has had vaginal itching.  See flowsheet for further details and programmatic requirements.    No LMP recorded. Patient is postmenopausal. Last sex: 3 wks ago BCM: post men Desires EC? n/a  Last pap per pt/review of record: July 2020 per pt at [facility]  Last HIV test per pt/review of record: 08/2019  Last tetanus vaccine: per pt <10 yrs Flu vaccine: has had this year per pt   No components found for: HCV  The following portions of the patient's history were reviewed and updated as appropriate: allergies, current medications, past medical history, past social history, past surgical history and problem list.  Objective:  There were no vitals filed for  this visit.   Physical Exam Vitals and nursing note reviewed.  Constitutional:      Appearance: Normal appearance.  HENT:     Head: Normocephalic and atraumatic.     Mouth/Throat:     Mouth: Mucous membranes are moist.     Pharynx: Oropharynx is clear. No oropharyngeal exudate or posterior oropharyngeal erythema.     Comments: +white coating on tongue Pulmonary:     Effort: Pulmonary effort is normal.  Abdominal:     General: Abdomen is flat.     Palpations: There is no mass.     Tenderness: There is no abdominal tenderness. There is no rebound.  Genitourinary:    General: Normal vulva.     Exam position: Lithotomy position.     Pubic Area: No rash or pubic lice.      Labia:        Right: No rash or lesion.        Left: No rash or lesion.      Vagina: Vaginal discharge (scant, clear, ph<4.5) present. No erythema, bleeding or lesions.     Cervix: No cervical motion tenderness, discharge, friability, lesion or erythema.     Uterus: Normal.      Adnexa: Right adnexa normal and left adnexa normal.     Rectum: Normal.     Comments: +redness inner/outer labia Lymphadenopathy:     Head:     Right side of head: No preauricular or posterior auricular adenopathy.     Left side of head: No preauricular or posterior auricular adenopathy.     Cervical: No cervical  adenopathy.     Upper Body:     Right upper body: No supraclavicular or axillary adenopathy.     Left upper body: No supraclavicular or axillary adenopathy.     Lower Body: No right inguinal adenopathy. No left inguinal adenopathy.  Skin:    General: Skin is warm and dry.     Findings: No rash.  Neurological:     Mental Status: She is alert and oriented to person, place, and time.    Results for orders placed or performed in visit on 04/03/20  WET PREP FOR [place], YEAST, CLUE  Result Value Ref Range   Trichomonas Exam Negative Negative   Yeast Exam Negative Negative   Clue Cell Exam Comment: Negative      Assessment and Plan:  Patricia Sherman is a 59 y.o. female presenting to the [facility] Department for STI screening    1. Screening examination for venereal disease -Pt with symptoms. Screenings today as below. Treat wet prep per standing order. -Patient does meet criteria for HepC Screening x1 due to age - has had this last year. -Counseled on warning s/sx and when to seek care. Recommended condom use with all sex and discussed importance of condom use for STI prevention. - WET PREP FOR [place], YEAST, [place] [facility] - Syphilis Serology, [facility]  2. Yeast infection -Wet prep negative but will treat symptoms of itching and redness presumptively as yeast.  - fluconazole (DIFLUCAN) 150 MG tablet; Take 1 tablet (150 mg total) by mouth once for 1 dose.  Dispense: 1 tablet; Refill: 0  The patient was dispensed fluconazole today. I provided counseling today regarding the medication. We discussed the medication, the side effects and when to call clinic. Patient given the opportunity to ask questions. Questions answered.   No follow-ups on file.  Future Appointments  Date Time Provider [place]  11/10/2020  9:00 AM Rubie Maid, MD [facility] None    Kandee Keen, PA-C

## 2020-04-03 NOTE — Progress Notes (Signed)
Wet mount reviewed with provider and is negative today, so no treatment needed for wet mount per standing order and per provider verbal order. Pt received Diflucan by Charlotte Sanes, PA-C. Provider orders completed.

## 2020-04-04 NOTE — Progress Notes (Signed)
Chart reviewed by Pharmacist  Suzanne Walker PharmD, Contract Pharmacist at [facility]  

## 2020-04-09 ENCOUNTER — Other Ambulatory Visit: Payer: Self-pay | Admitting: Internal Medicine

## 2020-05-04 ENCOUNTER — Telehealth: Payer: Self-pay | Admitting: Family Medicine

## 2020-05-04 NOTE — Telephone Encounter (Signed)
Have my test results came in

## 2020-05-05 ENCOUNTER — Telehealth: Payer: Self-pay | Admitting: Family Medicine

## 2020-05-05 NOTE — Telephone Encounter (Signed)
Call to patient. No answer. LMOM to return call. Tynesia Harral, RN  

## 2020-05-05 NOTE — Telephone Encounter (Signed)
Call to patient. RN reviewed std results with patient. All questions answered. Harvie Heck, RN

## 2020-05-05 NOTE — Telephone Encounter (Signed)
Returning call from earlier this morning. STD results.

## 2020-05-11 IMAGING — MG DIGITAL SCREENING BILATERAL MAMMOGRAM WITH TOMO AND CAD
8 series · 8 of 24 positions shown · non-contrast
Comparison: Previous exam(s).

CLINICAL DATA: Screening.

EXAM:
DIGITAL SCREENING BILATERAL MAMMOGRAM WITH TOMO AND CAD

[L CC synth-2D]
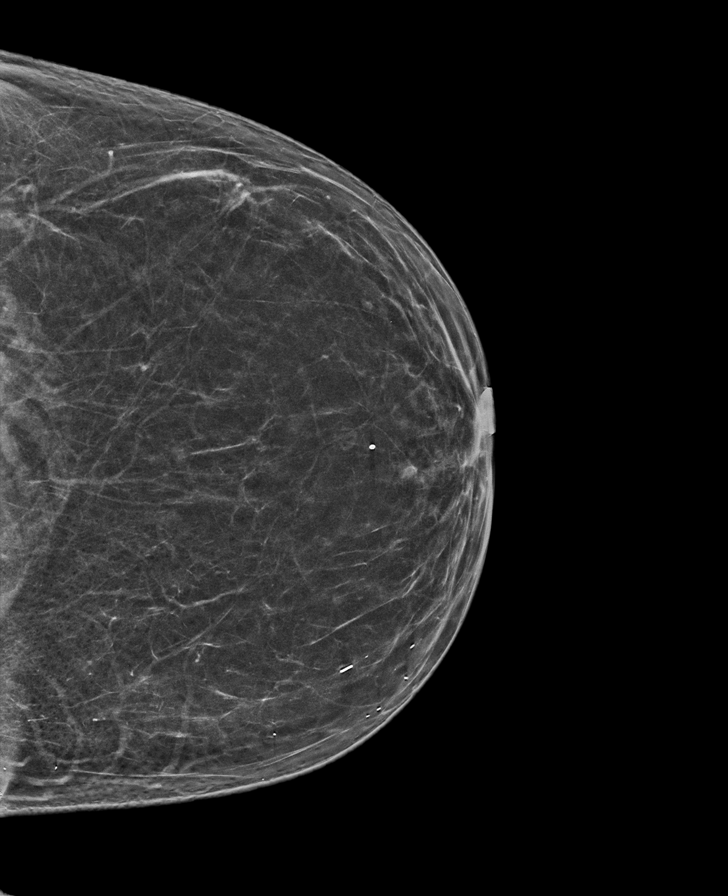

[R CC synth-2D]
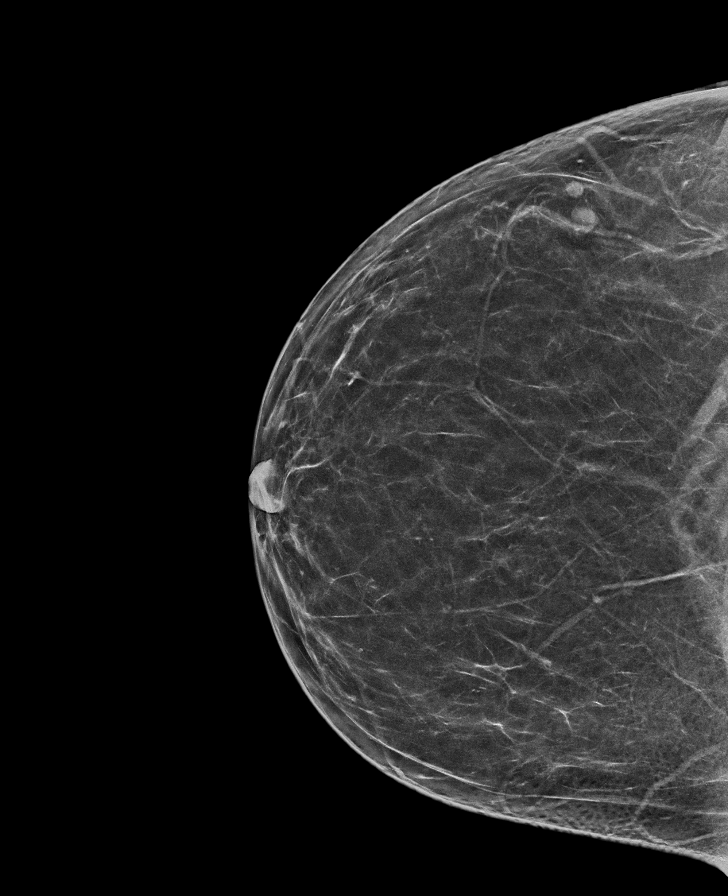

[R MLO synth-2D]
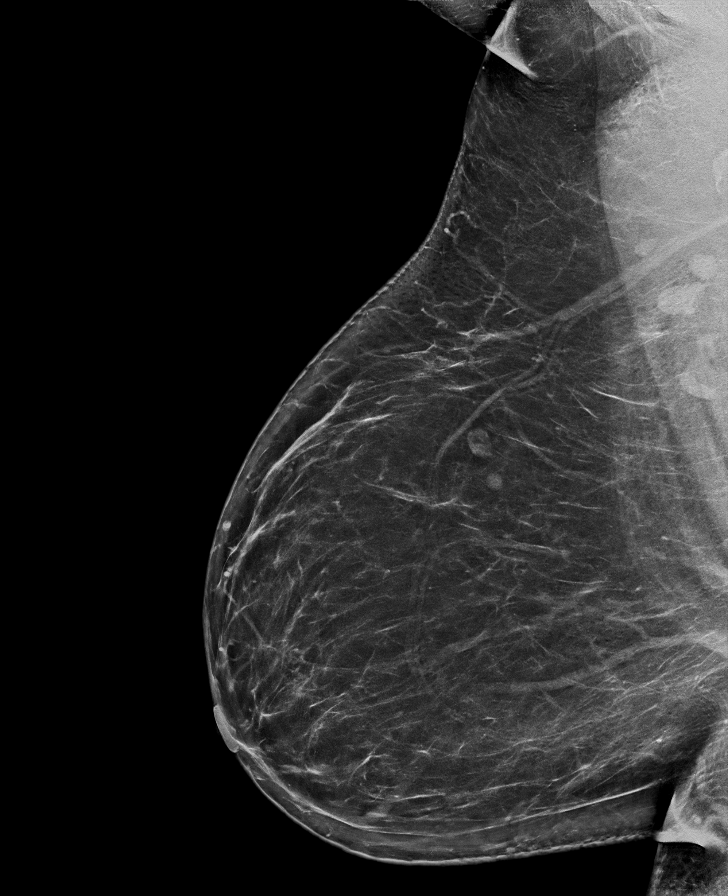

[L MLO synth-2D]
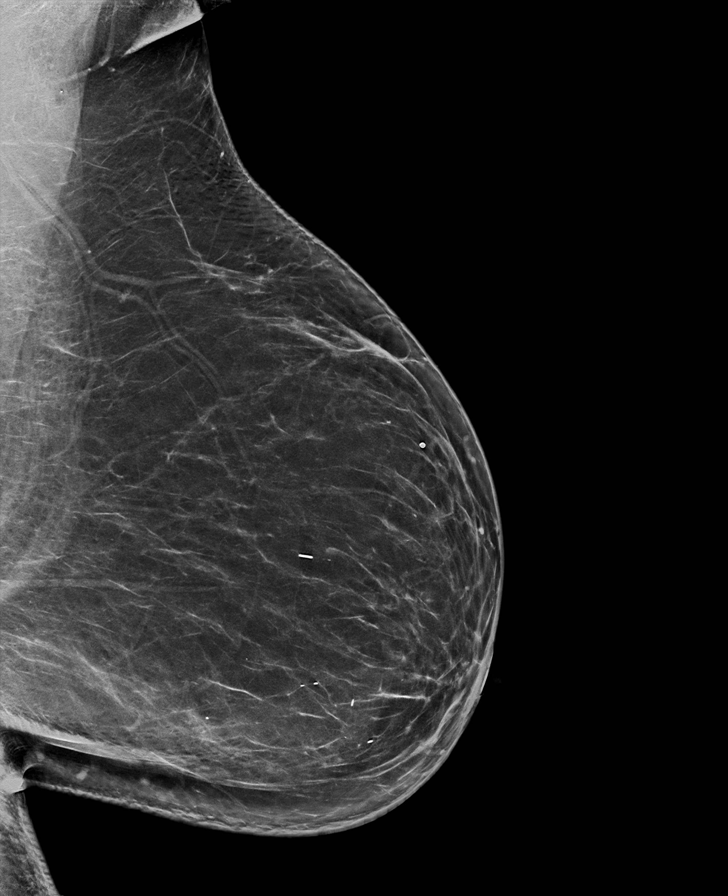

[R MLO tomo · tomo slice 41/82.0]
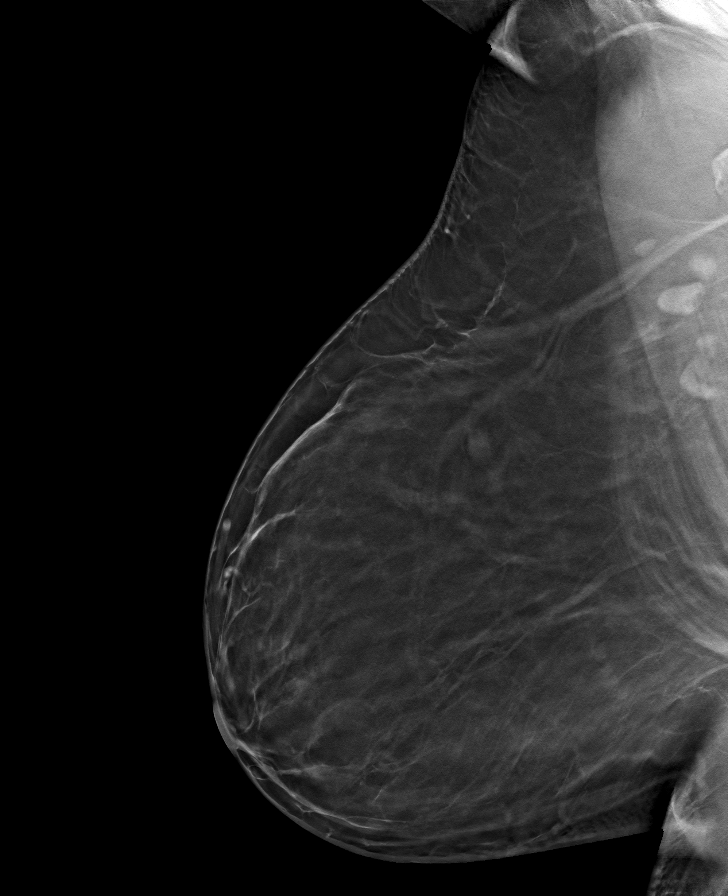

[L CC tomo · tomo slice 34/67.0]
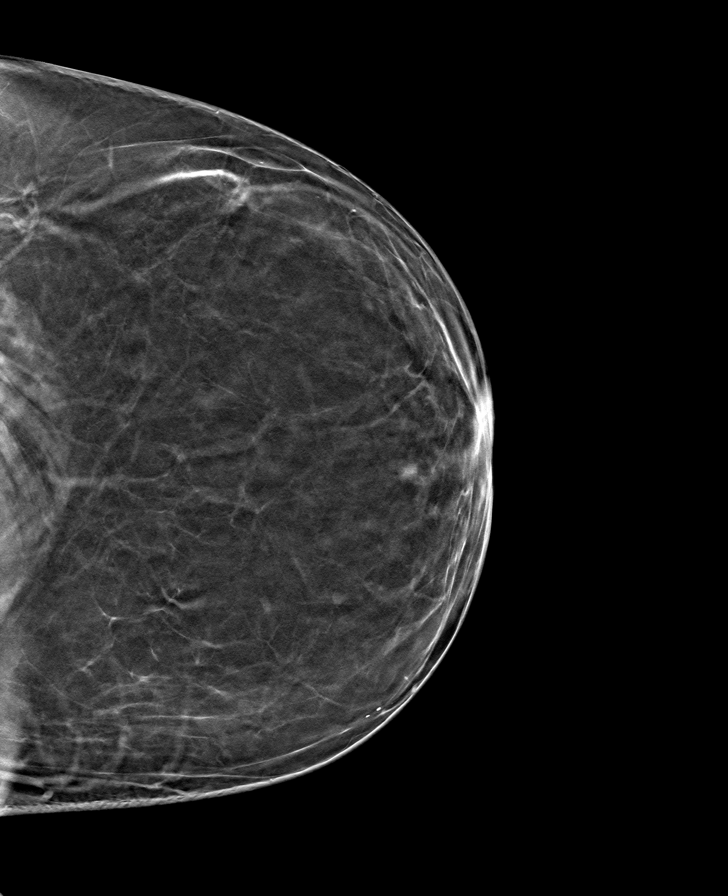

[L MLO tomo · tomo slice 40/79.0]
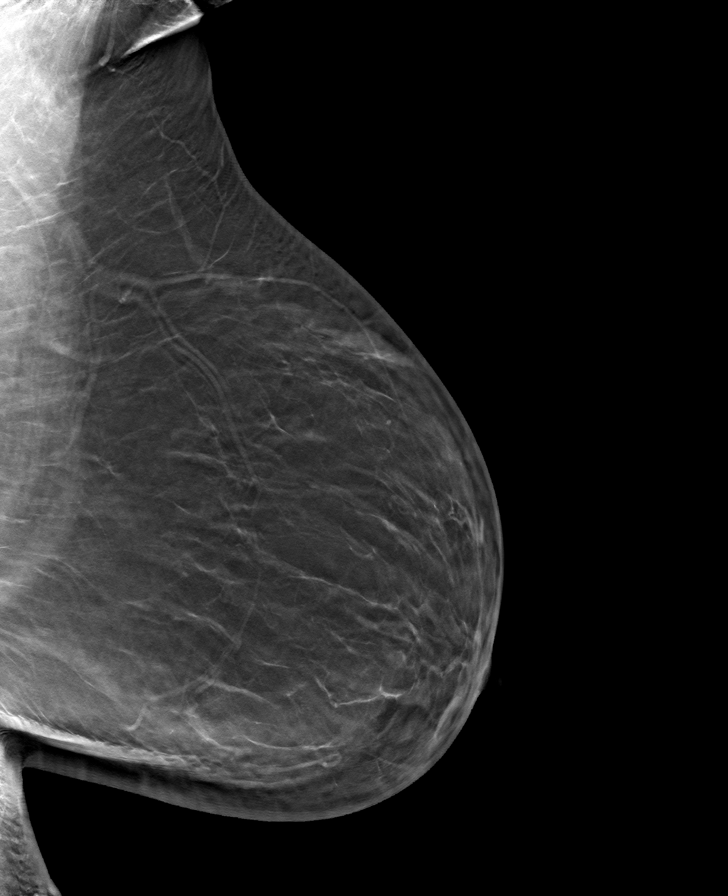

[R CC tomo · tomo slice 34/67.0]
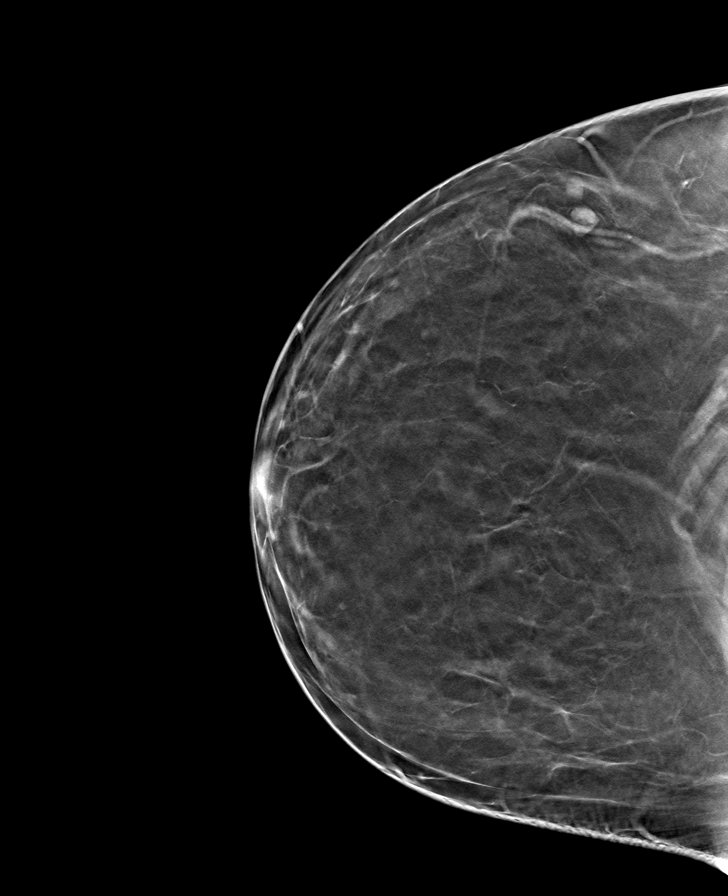

[8 of 24 positions shown; findings below may reference images not displayed]

ACR Breast Density Category b: There are scattered areas of
fibroglandular density.
FINDINGS: There are no findings suspicious for malignancy. Images were
processed with CAD.
IMPRESSION: No mammographic evidence of malignancy. A result letter of this
screening mammogram will be mailed directly to the patient.

RECOMMENDATION:
Screening mammogram in one year. (Code:[id number])

BI-RADS CATEGORY  1: Negative.

## 2020-06-05 DIAGNOSIS — E1165 Type 2 diabetes mellitus with hyperglycemia: Secondary | ICD-10-CM | POA: Diagnosis not present

## 2020-06-05 DIAGNOSIS — I10 Essential (primary) hypertension: Secondary | ICD-10-CM | POA: Diagnosis not present

## 2020-06-05 DIAGNOSIS — J302 Other seasonal allergic rhinitis: Secondary | ICD-10-CM | POA: Diagnosis not present

## 2020-06-23 ENCOUNTER — Other Ambulatory Visit: Payer: Self-pay | Admitting: Internal Medicine

## 2020-06-23 DIAGNOSIS — E1165 Type 2 diabetes mellitus with hyperglycemia: Secondary | ICD-10-CM | POA: Diagnosis not present

## 2020-06-23 DIAGNOSIS — E7800 Pure hypercholesterolemia, unspecified: Secondary | ICD-10-CM | POA: Diagnosis not present

## 2020-06-23 DIAGNOSIS — M1712 Unilateral primary osteoarthritis, left knee: Secondary | ICD-10-CM | POA: Diagnosis not present

## 2020-08-06 ENCOUNTER — Other Ambulatory Visit: Payer: Self-pay

## 2020-08-06 MED FILL — Dapagliflozin Propanediol Tab 10 MG (Base Equivalent): ORAL | 90 days supply | Qty: 90 | Fill #0 | Status: AC

## 2020-08-06 MED FILL — Sitagliptin Phosphate Tab 100 MG (Base Equiv): ORAL | 30 days supply | Qty: 30 | Fill #0 | Status: AC

## 2020-08-12 ENCOUNTER — Other Ambulatory Visit: Payer: Self-pay

## 2020-08-12 MED ORDER — OMEPRAZOLE 20 MG PO CPDR
20.0000 mg | DELAYED_RELEASE_CAPSULE | Freq: Every day | ORAL | 3 refills | Status: DC
  Filled 2020-08-12: qty 90, 90d supply, fill #0
  Filled 2020-11-05: qty 90, 90d supply, fill #1

## 2020-08-31 ENCOUNTER — Other Ambulatory Visit: Payer: Self-pay

## 2020-08-31 MED ORDER — FLUTICASONE PROPIONATE 50 MCG/ACT NA SUSP
NASAL | 11 refills | Status: DC
  Filled 2020-08-31: qty 16, 30d supply, fill #0

## 2020-08-31 MED ORDER — LEVOCETIRIZINE DIHYDROCHLORIDE 5 MG PO TABS
ORAL_TABLET | ORAL | 1 refills | Status: DC
  Filled 2020-08-31: qty 90, 90d supply, fill #0

## 2020-08-31 MED ORDER — AZITHROMYCIN 250 MG PO TABS
ORAL_TABLET | ORAL | 0 refills | Status: DC
  Filled 2020-08-31: qty 6, 5d supply, fill #0

## 2020-09-07 ENCOUNTER — Other Ambulatory Visit: Payer: Self-pay

## 2020-09-07 MED FILL — Sitagliptin Phosphate Tab 100 MG (Base Equiv): ORAL | 30 days supply | Qty: 30 | Fill #1 | Status: AC

## 2020-09-17 DIAGNOSIS — E1165 Type 2 diabetes mellitus with hyperglycemia: Secondary | ICD-10-CM | POA: Diagnosis not present

## 2020-09-17 DIAGNOSIS — M1712 Unilateral primary osteoarthritis, left knee: Secondary | ICD-10-CM | POA: Diagnosis not present

## 2020-09-17 DIAGNOSIS — Z114 Encounter for screening for human immunodeficiency virus [HIV]: Secondary | ICD-10-CM | POA: Diagnosis not present

## 2020-09-17 DIAGNOSIS — Z6841 Body Mass Index (BMI) 40.0 and over, adult: Secondary | ICD-10-CM | POA: Diagnosis not present

## 2020-09-17 DIAGNOSIS — E7800 Pure hypercholesterolemia, unspecified: Secondary | ICD-10-CM | POA: Diagnosis not present

## 2020-09-17 DIAGNOSIS — I10 Essential (primary) hypertension: Secondary | ICD-10-CM | POA: Diagnosis not present

## 2020-09-17 DIAGNOSIS — R8290 Unspecified abnormal findings in urine: Secondary | ICD-10-CM | POA: Diagnosis not present

## 2020-09-21 ENCOUNTER — Other Ambulatory Visit: Payer: Self-pay

## 2020-09-21 DIAGNOSIS — E1165 Type 2 diabetes mellitus with hyperglycemia: Secondary | ICD-10-CM | POA: Diagnosis not present

## 2020-09-21 DIAGNOSIS — L03311 Cellulitis of abdominal wall: Secondary | ICD-10-CM | POA: Diagnosis not present

## 2020-09-21 DIAGNOSIS — I10 Essential (primary) hypertension: Secondary | ICD-10-CM | POA: Diagnosis not present

## 2020-09-21 DIAGNOSIS — J302 Other seasonal allergic rhinitis: Secondary | ICD-10-CM | POA: Diagnosis not present

## 2020-09-21 MED ORDER — SHINGRIX 50 MCG/0.5ML IM SUSR
INTRAMUSCULAR | 1 refills | Status: DC
  Filled 2020-10-13: qty 1, 1d supply, fill #0
  Filled 2020-12-17: qty 1, 1d supply, fill #1

## 2020-09-21 MED ORDER — CEPHALEXIN 500 MG PO CAPS
ORAL_CAPSULE | ORAL | 0 refills | Status: DC
  Filled 2020-09-21: qty 30, 10d supply, fill #0

## 2020-09-24 ENCOUNTER — Other Ambulatory Visit: Payer: Self-pay | Admitting: Internal Medicine

## 2020-09-24 DIAGNOSIS — Z1231 Encounter for screening mammogram for malignant neoplasm of breast: Secondary | ICD-10-CM

## 2020-10-07 ENCOUNTER — Other Ambulatory Visit: Payer: Self-pay

## 2020-10-07 MED FILL — Sitagliptin Phosphate Tab 100 MG (Base Equiv): ORAL | 30 days supply | Qty: 30 | Fill #2 | Status: AC

## 2020-10-07 MED FILL — Atorvastatin Calcium Tab 10 MG (Base Equivalent): ORAL | 90 days supply | Qty: 90 | Fill #0 | Status: AC

## 2020-10-07 MED FILL — Losartan Potassium Tab 25 MG: ORAL | 90 days supply | Qty: 90 | Fill #0 | Status: AC

## 2020-10-08 ENCOUNTER — Ambulatory Visit
Admission: RE | Admit: 2020-10-08 | Discharge: 2020-10-08 | Disposition: A | Discharge status: Home / Self Care | Payer: 59 | Source: Ambulatory Visit | Attending: Internal Medicine | Admitting: Internal Medicine

## 2020-10-08 ENCOUNTER — Other Ambulatory Visit: Payer: Self-pay

## 2020-10-08 DIAGNOSIS — Z1231 Encounter for screening mammogram for malignant neoplasm of breast: Secondary | ICD-10-CM | POA: Diagnosis not present

## 2020-10-09 ENCOUNTER — Other Ambulatory Visit: Payer: Self-pay

## 2020-10-09 MED ORDER — GLIMEPIRIDE 4 MG PO TABS
ORAL_TABLET | Freq: Two times a day (BID) | ORAL | 1 refills | Status: DC
  Filled 2020-10-09: qty 180, 90d supply, fill #0

## 2020-10-13 ENCOUNTER — Other Ambulatory Visit: Payer: Self-pay

## 2020-10-13 MED FILL — Dapagliflozin Propanediol Tab 10 MG (Base Equivalent): ORAL | 90 days supply | Qty: 90 | Fill #1 | Status: AC

## 2020-10-13 MED FILL — Dapagliflozin Propanediol Tab 10 MG (Base Equivalent): ORAL | 90 days supply | Qty: 90 | Fill #1 | Status: CN

## 2020-10-19 ENCOUNTER — Other Ambulatory Visit: Payer: Self-pay

## 2020-11-05 ENCOUNTER — Other Ambulatory Visit: Payer: Self-pay

## 2020-11-05 MED FILL — Sitagliptin Phosphate Tab 100 MG (Base Equiv): ORAL | 30 days supply | Qty: 30 | Fill #3 | Status: AC

## 2020-11-06 ENCOUNTER — Other Ambulatory Visit: Payer: Self-pay

## 2020-11-06 ENCOUNTER — Other Ambulatory Visit (HOSPITAL_COMMUNITY): Payer: Self-pay

## 2020-11-10 ENCOUNTER — Ambulatory Visit (INDEPENDENT_AMBULATORY_CARE_PROVIDER_SITE_OTHER): Payer: 59 | Admitting: Obstetrics and Gynecology

## 2020-11-10 ENCOUNTER — Other Ambulatory Visit (HOSPITAL_COMMUNITY)
Admission: RE | Admit: 2020-11-10 | Discharge: 2020-11-10 | Disposition: A | Discharge status: Home / Self Care | Payer: 59 | Source: Ambulatory Visit | Attending: Obstetrics and Gynecology | Admitting: Obstetrics and Gynecology

## 2020-11-10 ENCOUNTER — Other Ambulatory Visit: Payer: Self-pay

## 2020-11-10 ENCOUNTER — Encounter: Payer: Self-pay | Admitting: Obstetrics and Gynecology

## 2020-11-10 VITALS — BP 149/93 | HR 87 | Ht 62.0 in | Wt 236.8 lb

## 2020-11-10 DIAGNOSIS — Z124 Encounter for screening for malignant neoplasm of cervix: Secondary | ICD-10-CM | POA: Diagnosis not present

## 2020-11-10 DIAGNOSIS — B372 Candidiasis of skin and nail: Secondary | ICD-10-CM

## 2020-11-10 DIAGNOSIS — K1321 Leukoplakia of oral mucosa, including tongue: Secondary | ICD-10-CM

## 2020-11-10 DIAGNOSIS — E119 Type 2 diabetes mellitus without complications: Secondary | ICD-10-CM | POA: Diagnosis not present

## 2020-11-10 DIAGNOSIS — N813 Complete uterovaginal prolapse: Secondary | ICD-10-CM | POA: Diagnosis not present

## 2020-11-10 DIAGNOSIS — Z01419 Encounter for gynecological examination (general) (routine) without abnormal findings: Secondary | ICD-10-CM | POA: Diagnosis not present

## 2020-11-10 DIAGNOSIS — I10 Essential (primary) hypertension: Secondary | ICD-10-CM

## 2020-11-10 MED ORDER — NYSTATIN 100000 UNIT/ML MT SUSP
5.0000 mL | Freq: Four times a day (QID) | OROMUCOSAL | 0 refills | Status: DC
  Filled 2020-11-10: qty 60, 3d supply, fill #0

## 2020-11-10 MED ORDER — NYSTATIN 100000 UNIT/GM EX CREA
1.0000 "application " | TOPICAL_CREAM | Freq: Two times a day (BID) | CUTANEOUS | 1 refills | Status: DC
  Filled 2020-11-10: qty 30, 15d supply, fill #0
  Filled 2021-10-25: qty 30, 15d supply, fill #1

## 2020-11-10 NOTE — Progress Notes (Signed)
ANNUAL PREVENTATIVE CARE GYNECOLOGY  ENCOUNTER NOTE  Subjective:       Patricia Sherman is a 60 y.o. [phone] female here for a routine annual gynecologic exam. She has a PMH of HTN, DM, and fibroid uterus. The patient is not sexually active. The patient has never taken hormone replacement therapy. Patient denies post-menopausal vaginal bleeding. The patient wears seatbelts: no. The patient participates in regular exercise: no. Has the patient ever been transfused or tattooed?: no. The patient reports that there is not domestic violence in her life.  Current complaints: 1.  Complaints of something white on her tongue.  Is brushing her tongue with her teeth and using a peroxide mouthwash. Also still noting small bumps on her tongue (this has been there for "quite some time"). Also notes "akward" taste in her month.      Gynecologic History No LMP recorded. Patient is postmenopausal. Contraception: post menopausal status Last Pap: 03/15/2017. Results were: normal Last mammogram: 10/08/2019. Results were: normal Last Colonoscopy: has not had one in some time.    Obstetric History OB History  Gravida Para Term Preterm AB Living  5 2 2   3 2   SAB IAB Ectopic Multiple Live Births    0     2    # Outcome Date GA Lbr Len/2nd Weight Sex Delivery Anes PTL Lv  5 Term 1992   7 lb 6.4 oz (3.357 kg) F Vag-Spont   LIV  4 Term 1985   6 lb 11.2 oz (3.039 kg) F Vag-Spont   LIV  3 AB           2 AB           1 AB             Past Medical History:  Diagnosis Date   Diabetes mellitus without complication ([place])    Environmental allergies    Hypertension    Uterine leiomyoma 02/09/2016    Family History  Problem Relation Age of Onset   Heart disease Mother    Hypertension Mother    Diabetes Sister    Hypertension Sister    Heart disease Brother    Hypertension Brother    Diabetes Daughter    Breast cancer Neg Hx    Ovarian cancer Neg Hx    Colon cancer Neg Hx     Past Surgical  History:  Procedure Laterality Date   CATARACT EXTRACTION     TONSILLECTOMY     TUBAL LIGATION      Social History   Socioeconomic History   Marital status: Single    Spouse name: Not on file   Number of children: Not on file   Years of education: Not on file   Highest education level: Not on file  Occupational History   Not on file  Tobacco Use   Smoking status: Never   Smokeless tobacco: Never  Vaping Use   Vaping Use: Never used  Substance and Sexual Activity   Alcohol use: No    Alcohol/week: 0.0 standard drinks   Drug use: No   Sexual activity: Not Currently    Partners: Male    Birth control/protection: Post-menopausal  Other Topics Concern   Not on file  Social History Narrative   Not on file   Social Determinants of Health   Financial Resource Strain: Not on file  Food Insecurity: Not on file  Transportation Needs: Not on file  Physical Activity: Not on file  Stress: Not on  file  Social Connections: Not on file  Intimate Partner Violence: Not on file    Current Outpatient Medications on File Prior to Visit  Medication Sig Dispense Refill   atorvastatin (LIPITOR) 10 MG tablet Take 10 mg daily by mouth.     atorvastatin (LIPITOR) 10 MG tablet TAKE 1 TABLET (10 MG TOTAL) BY MOUTH ONCE DAILY 90 tablet 3   dapagliflozin propanediol (FARXIGA) 10 MG TABS tablet TAKE 1 TABLET BY MOUTH ONCE DAILY EVERY MORNING 90 tablet 3   fexofenadine-pseudoephedrine (ALLEGRA-D) 60-120 MG 12 hr tablet Take 1 tablet by mouth 2 (two) times daily. 20 tablet 0   glimepiride (AMARYL) 4 MG tablet TAKE 1 TABLET BY MOUTH TWICE DAILY 180 tablet 1   levocetirizine (XYZAL) 5 MG tablet Take 1 tablet (5 mg total) by mouth every evening 90 tablet 1   loratadine (CLARITIN) 10 MG tablet Take 10 mg by mouth daily.     losartan (COZAAR) 25 MG tablet TAKE 1 TABLET (25 MG TOTAL) BY MOUTH ONCE DAILY 90 tablet 1   Multiple Vitamin (MULTIVITAMIN) tablet Take 1 tablet by mouth daily.     omeprazole  (PRILOSEC) 20 MG capsule Take 1 capsule (20 mg total) by mouth daily. 90 capsule 3   Semaglutide,0.25 or 0.5MG /DOS, 2 MG/1.5ML SOPN INJECT 0.25 MG UNDER THE SKIN EVERY WEEK FOR 4 WEEKS, THEN INCREASE TO 0.5 MG WEEKLY AFTER THAT 1.5 mL 5   sitaGLIPtin (JANUVIA) 100 MG tablet TAKE 1 TABLET BY MOUTH ONCE DAILY 90 tablet 3   azithromycin (ZITHROMAX) 250 MG tablet Take 2 tablets (500mg ) by mouth on Day 1. Take 1 tablet (250mg ) by mouth on Days 2-5. (Patient not taking: Reported on 11/10/2020) 6 tablet 0   cephALEXin (KEFLEX) 500 MG capsule Take 1 capsule (500 mg total) by mouth 3 (three) times daily for 10 days (Patient not taking: Reported on 11/10/2020) 30 capsule 0   FARXIGA 10 MG TABS tablet Take 10 mg by mouth daily. (Patient not taking: Reported on 11/10/2020)     fluconazole (DIFLUCAN) 150 MG tablet TAKE 1 TABLET (150 MG TOTAL) BY MOUTH ONCE FOR 1 DOSE. CAN TAKE ADDITIONAL DOSE THREE DAYS LATER IF SYMPTOMS PERSIST (Patient not taking: Reported on 11/10/2020) 1 tablet 3   fluticasone (FLONASE) 50 MCG/ACT nasal spray Place 2 sprays into both nostrils once daily (Patient not taking: Reported on 11/10/2020) 16 g 11   glimepiride (AMARYL) 2 MG tablet  (Patient not taking: Reported on 11/10/2020)  3   JANUVIA 100 MG tablet  (Patient not taking: Reported on 11/10/2020)  5   losartan (COZAAR) 25 MG tablet Take 25 mg by mouth daily. (Patient not taking: Reported on 11/10/2020)     losartan (COZAAR) 25 MG tablet TAKE 1 TABLET (25 MG TOTAL) BY MOUTH ONCE DAILY (Patient not taking: Reported on 11/10/2020) 90 tablet 1   metroNIDAZOLE (METROGEL) 0.75 % vaginal gel PLACE 1 APPLICATORFUL VAGINALLY AT BEDTIME FOR 5 DAYS (Patient not taking: Reported on 11/10/2020) 70 g 0   triamcinolone (KENALOG) 0.1 % paste APPLY TO THE INFLAMED AREAS OR ULCERS IN YOUR MOUTH TWO TIMES DAILY (Patient not taking: Reported on 11/10/2020) 5 g 0   Zoster Vaccine Adjuvanted [facility]) injection Inject 0.5 mL into the muscle once for 1 dose (Patient  not taking: Reported on 11/10/2020) 1 each 1   No current facility-administered medications on file prior to visit.    Allergies  Allergen Reactions   Metformin Diarrhea and Other (See Comments)    Other reaction(s):  Other (See Comments) Elevated heart rate Elevated heart rate   Sulfa Antibiotics Rash      Review of Systems ROS Review of Systems - General ROS: negative for - chills, fatigue, fever,  weight gain or weight loss.  Psychological ROS: negative for - anxiety, decreased libido, depression, mood swings, physical abuse or sexual abuse Ophthalmic ROS: negative for - blurry vision, eye pain or loss of vision ENT ROS: negative for - headaches, hearing change, visual changes or vocal changes. Positive for "rash on tongue and bumps" (see HPI) Allergy and Immunology ROS: negative for - hives, itchy/watery eyes or seasonal allergies Hematological and Lymphatic ROS: negative for - bleeding problems, bruising, swollen lymph nodes or weight loss Endocrine ROS: negative for - galactorrhea, hair pattern changes, hot flashes, malaise/lethargy, mood swings, palpitations, polydipsia/polyuria, skin changes, temperature intolerance or unexpected weight changes Breast ROS: negative for - new or changing breast lumps or nipple discharge Respiratory ROS: negative for - cough or shortness of breath Cardiovascular ROS: negative for - chest pain, irregular heartbeat, palpitations or shortness of breath Gastrointestinal ROS: no abdominal pain, change in bowel habits, or black or bloody stools Genito-Urinary ROS: no dysuria, trouble voiding, or hematuria. Mild vulvar irritation.  Musculoskeletal ROS: negative for - joint pain or joint stiffness Neurological ROS: negative for - bowel and bladder control changes Dermatological ROS: negative for rash and skin lesion changes   Objective:   BP (!) 149/93   Pulse 87   Ht 5\' 2"  (1.575 m)   Wt 236 lb 12.8 oz (107.4 kg)   BMI 43.31 kg/m   CONSTITUTIONAL: Well-developed, well-nourished female in no acute distress. Morbid obesity PSYCHIATRIC: Normal mood and affect. Normal behavior. Normal judgment and thought content. [place]: Alert and oriented to person, place, and time. Normal muscle tone coordination. No cranial nerve deficit noted. HENT:  Normocephalic, atraumatic, External right and left ear normal. Oropharynx with 2 small raised papillary nodules on right side of tongue and mild leukoplakia on left side; moist. Conjunctivae and EOM are normal. Pupils are equal, round, and reactive to light. No scleral icterus.  Normal range of neck motion, supple, no masses.  Normal thyroid.  SKIN: Skin is warm and dry. No rash noted. Not diaphoretic. No erythema. No pallor. CARDIOVASCULAR: Normal heart rate noted, regular rhythm, no murmur. RESPIRATORY: Clear to auscultation bilaterally. Effort and breath sounds normal, no problems with respiration noted. BREASTS: Symmetric in size. No masses, skin changes, nipple drainage, or lymphadenopathy. ABDOMEN: Soft, normal bowel sounds, no distention noted.  No tenderness, rebound or guarding.  BLADDER: Normal PELVIC:  Bladder no bladder distension noted  Urethra: normal appearing urethra with no masses, tenderness or lesions  Vulva: moderately erythematous vulva with mild tenderness.  No masses, or lesions  Vagina: normal appearing vagina with mild vaginal atrophy. No  discharge, no lesions  Cervix: normal appearing cervix without discharge or lesions  Uterus: uterus is normal size, shape, consistency and nontender.  Grade 2-3 uterine prolapse noted.   Adnexa: normal adnexa in size, nontender and no masses  RV: External Exam NormaI, No Rectal Masses and Normal Sphincter tone  MUSCULOSKELETAL: Normal range of motion. No tenderness.  No cyanosis, clubbing, or edema.  2+ distal pulses. LYMPHATIC: No Axillary, Supraclavicular, or Inguinal Adenopathy.   Labs:  Reviewed in Care Everywhere,  performed 09/25/2019 by PCP.   Assessment:   1. Encounter for well woman exam with routine gynecological exam   2. Yeast dermatitis   3. Tongue leukoplakia   4. Cervical cancer  screening   5. Third degree uterine prolapse   6. Obesity, Class III, BMI 40-49.9 (morbid obesity) (HCC) 243 lb   7. Diabetes mellitus without complication ([place])   8. Primary hypertension    Plan:  - Pap: Pap Co Test performed today.  - Mammogram:  Completed.  Normal results.  Continue yearly screening.  - Stool Guaiac Testing: Scheduled for next month.  - Labs:  None ordered.  Usually has labs drawn by PCP each year.  - Routine preventative health maintenance measures emphasized: Exercise/Diet/Weight control, Alcohol/Substance use risks, Stress Management and Safe Sex - Hypertension and DM to be managed by PCP. Reports recent improvement in A1c (from ~ 10 down to ~ 8).  - Has completed COVID vaccine series . Is eligible for booster.  Incomplete uterine prolapse not bothersome to patient at this time.  Continue to monitor. -Patient with apparent yeast infection, has leukoplakia on her tongue as well as vulvar dermatitis.  Will prescribe nystatin oral solution as well as nystatin cream for her vulva.  Advised to continue working on control of her diabetes as this will help to decrease infections.  Also advised on use of powder to manage moisture in crease regions including groin, vulva, and breast. Return to [place], MD  [facility]

## 2020-11-10 NOTE — Patient Instructions (Signed)
Breast Self-Awareness Breast self-awareness is knowing how your breasts look and feel. Doing breast self-awareness is important. It allows you to catch a breast problem early while it is still small and can be treated. All women should do breast self-awareness, including women who have had breast implants. Tell your doctorif you notice a change in your breasts. What you need: A mirror. A well-lit room. How to do a breast self-exam A breast self-exam is one way to learn what is normal for your breasts and tocheck for changes. To do a breast self-exam: Look for changes  Take off all the clothes above your waist. Stand in front of a mirror in a room with good lighting. Put your hands on your hips. Push your hands down. Look at your breasts and nipples in the mirror to see if one breast or nipple looks different from the other. Check to see if: The shape of one breast is different. The size of one breast is different. There are wrinkles, dips, and bumps in one breast and not the other. Look at each breast for changes in the skin, such as: Redness. Scaly areas. Look for changes in your nipples, such as: Liquid around the nipples. Bleeding. Dimpling. Redness. A change in where the nipples are.  Feel for changes  Lie on your back on the floor. Feel each breast. To do this, follow these steps: Pick a breast to feel. Put the arm closest to that breast above your head. Use your other arm to feel the nipple area of your breast. Feel the area with the pads of your three middle fingers by making small circles with your fingers. For the first circle, press lightly. For the second circle, press harder. For the third circle, press even harder. Keep making circles with your fingers at the different pressures as you move down your breast. Stop when you feel your ribs. Move your fingers a little toward the center of your body. Start making circles with your fingers again, this time going up until  you reach your collarbone. Keep making up-and-down circles until you reach your armpit. Remember to keep using the three pressures. Feel the other breast in the same way. Sit or stand in the tub or shower. With soapy water on your skin, feel each breast the same way you did in step 2 when you were lying on the floor.  Write down what you find Writing down what you find can help you remember what to tell your doctor. Write down: What is normal for each breast. Any changes you find in each breast, including: The kind of changes you find. Whether you have pain. Size and location of any lumps. When you last had your menstrual period. General tips Check your breasts every month. If you are breastfeeding, the best time to check your breasts is after you feed your baby or after you use a breast pump. If you get menstrual periods, the best time to check your breasts is 5-7 days after your menstrual period is over. With time, you will become comfortable with the self-exam, and you will begin to know if there are changes in your breasts. Contact a doctor if you: See a change in the shape or size of your breasts or nipples. See a change in the skin of your breast or nipples, such as red or scaly skin. Have fluid coming from your nipples that is not normal. Find a lump or thick area that was not there before. Have pain in   your breasts. Have any concerns about your breast health. Summary Breast self-awareness includes looking for changes in your breasts, as well as feeling for changes within your breasts. Breast self-awareness should be done in front of a mirror in a well-lit room. You should check your breasts every month. If you get menstrual periods, the best time to check your breasts is 5-7 days after your menstrual period is over. Let your doctor know of any changes you see in your breasts, including changes in size, changes on the skin, pain or tenderness, or fluid from your nipples that is  not normal. This information is not intended to replace advice given to you by your health care provider. Make sure you discuss any questions you have with your healthcare provider. Document Revised: 12/05/2017 Document Reviewed: 12/05/2017 Elsevier Patient Education  2022 Elsevier Inc.      Preventive Care 61-72 Years Old, Female Preventive care refers to lifestyle choices and visits with your health care provider that can promote health and wellness. This includes: A yearly physical exam. This is also called an annual wellness visit. Regular dental and eye exams. Immunizations. Screening for certain conditions. Healthy lifestyle choices, such as: Eating a healthy diet. Getting regular exercise. Not using drugs or products that contain nicotine and tobacco. Limiting alcohol use. What can I expect for my preventive care visit? Physical exam Your health care provider will check your: Height and weight. These may be used to calculate your BMI (body mass index). BMI is a measurement that tells if you are at a healthy weight. Heart rate and blood pressure. Body temperature. Skin for abnormal spots. Counseling Your health care provider may ask you questions about your: Past medical problems. Family's medical history. Alcohol, tobacco, and drug use. Emotional well-being. Home life and relationship well-being. Sexual activity. Diet, exercise, and sleep habits. Work and work Statistician. Access to firearms. Method of birth control. Menstrual cycle. Pregnancy history. What immunizations do I need?  Vaccines are usually given at various ages, according to a schedule. Your health care provider will recommend vaccines for you based on your age, medicalhistory, and lifestyle or other factors, such as travel or where you work. What tests do I need? Blood tests Lipid and cholesterol levels. These may be checked every 5 years, or more often if you are over 65 years old. Hepatitis C  test. Hepatitis B test. Screening Lung cancer screening. You may have this screening every year starting at age 9 if you have a 30-pack-year history of smoking and currently smoke or have quit within the past 15 years. Colorectal cancer screening. All adults should have this screening starting at age 1 and continuing until age 20. Your health care provider may recommend screening at age 68 if you are at increased risk. You will have tests every 1-10 years, depending on your results and the type of screening test. Diabetes screening. This is done by checking your blood sugar (glucose) after you have not eaten for a while (fasting). You may have this done every 1-3 years. Mammogram. This may be done every 1-2 years. Talk with your health care provider about when you should start having regular mammograms. This may depend on whether you have a family history of breast cancer. BRCA-related cancer screening. This may be done if you have a family history of breast, ovarian, tubal, or peritoneal cancers. Pelvic exam and Pap test. This may be done every 3 years starting at age 15. Starting at age 36, this may be done  5 years if you have a Pap test in combination with an HPV test. Other tests STD (sexually transmitted disease) testing, if you are at risk. Bone density scan. This is done to screen for osteoporosis. You may have this scan if you are at high risk for osteoporosis. Talk with your health care provider about your test results, treatment options,and if necessary, the need for more tests. Follow these instructions at home: Eating and drinking  Eat a diet that includes fresh fruits and vegetables, whole grains, lean protein, and low-fat dairy products. Take vitamin and mineral supplements as recommended by your health care provider. Do not drink alcohol if: Your health care provider tells you not to drink. You are pregnant, may be pregnant, or are planning to become pregnant. If  you drink alcohol: Limit how much you have to 0-1 drink a day. Be aware of how much alcohol is in your drink. In the [place] one drink equals one 12 oz bottle of beer (355 mL), one 5 oz glass of wine (148 mL), or one 1 oz glass of hard liquor (44 mL).  Lifestyle Take daily care of your teeth and gums. Brush your teeth every morning and night with fluoride toothpaste. Floss one time each day. Stay active. Exercise for at least 30 minutes 5 or more days each week. Do not use any products that contain nicotine or tobacco, such as cigarettes, e-cigarettes, and chewing tobacco. If you need help quitting, ask your health care provider. Do not use drugs. If you are sexually active, practice safe sex. Use a condom or other form of protection to prevent STIs (sexually transmitted infections). If you do not wish to become pregnant, use a form of birth control. If you plan to become pregnant, see your health care provider for a prepregnancy visit. If told by your health care provider, take low-dose aspirin daily starting at age 50. Find healthy ways to cope with stress, such as: Meditation, yoga, or listening to music. Journaling. Talking to a trusted person. Spending time with friends and family. Safety Always wear your seat belt while driving or riding in a vehicle. Do not drive: If you have been drinking alcohol. Do not ride with someone who has been drinking. When you are tired or distracted. While texting. Wear a helmet and other protective equipment during sports activities. If you have firearms in your house, make sure you follow all gun safety procedures. What's next? Visit your health care provider once a year for an annual wellness visit. Ask your health care provider how often you should have your eyes and teeth checked. Stay up to date on all vaccines. This information is not intended to replace advice given to you by your health care provider. Make sure you discuss any questions you  have with your healthcare provider. Document Revised: 01/21/2020 Document Reviewed: 12/28/2017 Elsevier Patient Education  2022 Elsevier Inc.  

## 2020-11-10 NOTE — Progress Notes (Signed)
Pt present for annual exam. Pt stated that she was doing well. Pt stated having a colon screening in August 2022.

## 2020-11-17 LAB — CYTOLOGY - PAP
Comment: NEGATIVE
Diagnosis: NEGATIVE
High risk HPV: NEGATIVE

## 2020-12-01 ENCOUNTER — Telehealth: Payer: Self-pay | Admitting: Obstetrics and Gynecology

## 2020-12-01 NOTE — Telephone Encounter (Signed)
Pt called asking to speak to a nurse states  that she has been having an odor and discharge and pain when she walks. Please Advise.

## 2020-12-07 ENCOUNTER — Other Ambulatory Visit: Payer: Self-pay

## 2020-12-07 MED FILL — Sitagliptin Phosphate Tab 100 MG (Base Equiv): ORAL | 30 days supply | Qty: 30 | Fill #4 | Status: AC

## 2020-12-10 DIAGNOSIS — R350 Frequency of micturition: Secondary | ICD-10-CM | POA: Diagnosis not present

## 2020-12-15 ENCOUNTER — Other Ambulatory Visit: Payer: Self-pay

## 2020-12-17 ENCOUNTER — Other Ambulatory Visit: Payer: Self-pay

## 2020-12-22 ENCOUNTER — Encounter: Payer: 59 | Admitting: Obstetrics and Gynecology

## 2020-12-22 DIAGNOSIS — Z1211 Encounter for screening for malignant neoplasm of colon: Secondary | ICD-10-CM | POA: Diagnosis not present

## 2020-12-22 DIAGNOSIS — Z01818 Encounter for other preprocedural examination: Secondary | ICD-10-CM | POA: Diagnosis not present

## 2020-12-30 DIAGNOSIS — R8290 Unspecified abnormal findings in urine: Secondary | ICD-10-CM | POA: Diagnosis not present

## 2020-12-30 DIAGNOSIS — I10 Essential (primary) hypertension: Secondary | ICD-10-CM | POA: Diagnosis not present

## 2020-12-30 DIAGNOSIS — E7800 Pure hypercholesterolemia, unspecified: Secondary | ICD-10-CM | POA: Diagnosis not present

## 2020-12-30 DIAGNOSIS — E1165 Type 2 diabetes mellitus with hyperglycemia: Secondary | ICD-10-CM | POA: Diagnosis not present

## 2020-12-30 DIAGNOSIS — J302 Other seasonal allergic rhinitis: Secondary | ICD-10-CM | POA: Diagnosis not present

## 2021-01-06 ENCOUNTER — Other Ambulatory Visit: Payer: Self-pay

## 2021-01-06 DIAGNOSIS — Z0000 Encounter for general adult medical examination without abnormal findings: Secondary | ICD-10-CM | POA: Diagnosis not present

## 2021-01-06 DIAGNOSIS — M1712 Unilateral primary osteoarthritis, left knee: Secondary | ICD-10-CM | POA: Diagnosis not present

## 2021-01-06 DIAGNOSIS — I10 Essential (primary) hypertension: Secondary | ICD-10-CM | POA: Diagnosis not present

## 2021-01-06 DIAGNOSIS — Z1331 Encounter for screening for depression: Secondary | ICD-10-CM | POA: Diagnosis not present

## 2021-01-06 DIAGNOSIS — E1165 Type 2 diabetes mellitus with hyperglycemia: Secondary | ICD-10-CM | POA: Diagnosis not present

## 2021-01-06 MED ORDER — OMEPRAZOLE 20 MG PO CPDR
DELAYED_RELEASE_CAPSULE | ORAL | 3 refills | Status: DC
  Filled 2021-01-06 – 2021-02-01 (×2): qty 180, 90d supply, fill #0
  Filled 2021-06-30: qty 180, 90d supply, fill #1
  Filled 2021-09-28: qty 180, 90d supply, fill #2

## 2021-01-06 MED ORDER — LOSARTAN POTASSIUM 50 MG PO TABS
50.0000 mg | ORAL_TABLET | Freq: Every day | ORAL | 11 refills | Status: DC
  Filled 2021-01-06: qty 30, 30d supply, fill #0
  Filled 2021-03-09: qty 30, 30d supply, fill #1

## 2021-01-06 MED ORDER — PIOGLITAZONE HCL 30 MG PO TABS
ORAL_TABLET | ORAL | 5 refills | Status: DC
  Filled 2021-01-06: qty 30, 30d supply, fill #0

## 2021-01-06 MED FILL — Sitagliptin Phosphate Tab 100 MG (Base Equiv): ORAL | 30 days supply | Qty: 30 | Fill #5 | Status: AC

## 2021-01-06 MED FILL — Atorvastatin Calcium Tab 10 MG (Base Equivalent): ORAL | 90 days supply | Qty: 90 | Fill #1 | Status: AC

## 2021-01-07 ENCOUNTER — Other Ambulatory Visit: Payer: Self-pay

## 2021-01-07 MED ORDER — GLIMEPIRIDE 4 MG PO TABS
ORAL_TABLET | Freq: Two times a day (BID) | ORAL | 1 refills | Status: DC
  Filled 2021-01-07: qty 180, 90d supply, fill #0
  Filled 2021-12-24: qty 180, 90d supply, fill #1

## 2021-01-12 ENCOUNTER — Other Ambulatory Visit: Payer: Self-pay

## 2021-01-19 DIAGNOSIS — E119 Type 2 diabetes mellitus without complications: Secondary | ICD-10-CM | POA: Diagnosis not present

## 2021-01-25 ENCOUNTER — Other Ambulatory Visit: Payer: Self-pay

## 2021-02-01 ENCOUNTER — Other Ambulatory Visit: Payer: Self-pay

## 2021-02-01 MED ORDER — PEG 3350-KCL-NA BICARB-NACL 420 G PO SOLR
ORAL | 0 refills | Status: DC
  Filled 2021-02-01: qty 4000, 1d supply, fill #0

## 2021-02-02 ENCOUNTER — Other Ambulatory Visit: Payer: Self-pay

## 2021-02-02 DIAGNOSIS — H0014 Chalazion left upper eyelid: Secondary | ICD-10-CM | POA: Diagnosis not present

## 2021-02-02 MED ORDER — CEPHALEXIN 500 MG PO CAPS
500.0000 mg | ORAL_CAPSULE | Freq: Two times a day (BID) | ORAL | 0 refills | Status: DC
  Filled 2021-02-02: qty 20, 10d supply, fill #0

## 2021-02-02 MED ORDER — FARXIGA 10 MG PO TABS
ORAL_TABLET | ORAL | 3 refills | Status: DC
  Filled 2021-02-02: qty 90, 90d supply, fill #0
  Filled 2021-12-24 – 2022-01-21 (×2): qty 90, 90d supply, fill #1

## 2021-02-02 MED ORDER — SITAGLIPTIN PHOSPHATE 100 MG PO TABS
ORAL_TABLET | Freq: Every day | ORAL | 3 refills | Status: DC
  Filled 2021-02-02: qty 30, 30d supply, fill #0
  Filled 2021-03-09: qty 30, 30d supply, fill #1
  Filled 2021-12-24: qty 30, 30d supply, fill #2

## 2021-02-03 ENCOUNTER — Encounter: Payer: Self-pay | Admitting: *Deleted

## 2021-02-04 ENCOUNTER — Ambulatory Visit: Payer: 59 | Admitting: Anesthesiology

## 2021-02-04 ENCOUNTER — Ambulatory Visit
Admission: RE | Admit: 2021-02-04 | Discharge: 2021-02-04 | Disposition: A | Discharge status: Home / Self Care | Payer: 59 | Attending: Gastroenterology | Admitting: Gastroenterology

## 2021-02-04 ENCOUNTER — Other Ambulatory Visit: Payer: Self-pay

## 2021-02-04 ENCOUNTER — Encounter
Admission: RE | Disposition: A | Discharge status: Home / Self Care | Payer: Self-pay | Source: Home / Self Care | Attending: Gastroenterology

## 2021-02-04 ENCOUNTER — Encounter: Payer: Self-pay | Admitting: Anesthesiology

## 2021-02-04 DIAGNOSIS — E7800 Pure hypercholesterolemia, unspecified: Secondary | ICD-10-CM | POA: Diagnosis not present

## 2021-02-04 DIAGNOSIS — Z7984 Long term (current) use of oral hypoglycemic drugs: Secondary | ICD-10-CM | POA: Diagnosis not present

## 2021-02-04 DIAGNOSIS — K5730 Diverticulosis of large intestine without perforation or abscess without bleeding: Secondary | ICD-10-CM | POA: Insufficient documentation

## 2021-02-04 DIAGNOSIS — K635 Polyp of colon: Secondary | ICD-10-CM | POA: Diagnosis not present

## 2021-02-04 DIAGNOSIS — D125 Benign neoplasm of sigmoid colon: Secondary | ICD-10-CM | POA: Insufficient documentation

## 2021-02-04 DIAGNOSIS — K640 First degree hemorrhoids: Secondary | ICD-10-CM | POA: Insufficient documentation

## 2021-02-04 DIAGNOSIS — Z882 Allergy status to sulfonamides status: Secondary | ICD-10-CM | POA: Insufficient documentation

## 2021-02-04 DIAGNOSIS — K621 Rectal polyp: Secondary | ICD-10-CM | POA: Diagnosis not present

## 2021-02-04 DIAGNOSIS — D128 Benign neoplasm of rectum: Secondary | ICD-10-CM | POA: Diagnosis not present

## 2021-02-04 DIAGNOSIS — I10 Essential (primary) hypertension: Secondary | ICD-10-CM | POA: Diagnosis not present

## 2021-02-04 DIAGNOSIS — Z1211 Encounter for screening for malignant neoplasm of colon: Secondary | ICD-10-CM | POA: Insufficient documentation

## 2021-02-04 DIAGNOSIS — E785 Hyperlipidemia, unspecified: Secondary | ICD-10-CM | POA: Insufficient documentation

## 2021-02-04 DIAGNOSIS — Z79899 Other long term (current) drug therapy: Secondary | ICD-10-CM | POA: Diagnosis not present

## 2021-02-04 DIAGNOSIS — K649 Unspecified hemorrhoids: Secondary | ICD-10-CM | POA: Diagnosis not present

## 2021-02-04 DIAGNOSIS — Z6841 Body Mass Index (BMI) 40.0 and over, adult: Secondary | ICD-10-CM | POA: Diagnosis not present

## 2021-02-04 DIAGNOSIS — D123 Benign neoplasm of transverse colon: Secondary | ICD-10-CM | POA: Insufficient documentation

## 2021-02-04 DIAGNOSIS — D127 Benign neoplasm of rectosigmoid junction: Secondary | ICD-10-CM | POA: Insufficient documentation

## 2021-02-04 DIAGNOSIS — E119 Type 2 diabetes mellitus without complications: Secondary | ICD-10-CM | POA: Diagnosis not present

## 2021-02-04 HISTORY — DX: Hyperlipidemia, unspecified: E78.5

## 2021-02-04 HISTORY — PX: COLONOSCOPY WITH PROPOFOL: SHX5780

## 2021-02-04 LAB — GLUCOSE, CAPILLARY: Glucose-Capillary: 164 mg/dL — ABNORMAL HIGH (ref 70–99)

## 2021-02-04 SURGERY — COLONOSCOPY WITH PROPOFOL
Anesthesia: General

## 2021-02-04 MED ORDER — PROPOFOL 10 MG/ML IV BOLUS
INTRAVENOUS | Status: AC
  Filled 2021-02-04: qty 20

## 2021-02-04 MED ORDER — SODIUM CHLORIDE 0.9 % IV SOLN: INTRAVENOUS | Status: DC

## 2021-02-04 MED ORDER — PROPOFOL 500 MG/50ML IV EMUL
INTRAVENOUS | Status: DC | PRN
  Administered 2021-02-04: 150 ug/kg/min via INTRAVENOUS

## 2021-02-04 MED ORDER — PROPOFOL 500 MG/50ML IV EMUL
INTRAVENOUS | Status: AC
  Filled 2021-02-04: qty 50

## 2021-02-04 NOTE — Interval H&P Note (Signed)
History and Physical Interval Note: Preprocedure H&P from 02/04/21  was reviewed and there was no interval change after seeing and examining the patient.  Written consent was obtained from the patient after discussion of risks, benefits, and alternatives. Patient has consented to proceed with Colonoscopy with possible intervention   02/04/2021 10:38 AM  Helen Hashimoto  has presented today for surgery, with the diagnosis of Z12.11 Colon Cancer Screening.  The various methods of treatment have been discussed with the patient and family. After consideration of risks, benefits and other options for treatment, the patient has consented to  Procedure(s): COLONOSCOPY WITH PROPOFOL (N/A) as a surgical intervention.  The patient's history has been reviewed, patient examined, no change in status, stable for surgery.  I have reviewed the patient's chart and labs.  Questions were answered to the patient's satisfaction.     Annamaria Helling

## 2021-02-04 NOTE — Anesthesia Preprocedure Evaluation (Addendum)
Anesthesia Evaluation  Patient identified by MRN, date of birth, ID band Patient awake    Reviewed: Allergy & Precautions, NPO status , Patient's Chart, lab work & pertinent test results  Airway Mallampati: IV  TM Distance: >3 FB Neck ROM: Full    Dental  (+) Partial Upper   Pulmonary neg pulmonary ROS,    Pulmonary exam normal        Cardiovascular hypertension, Pt. on medications Normal cardiovascular exam     Neuro/Psych negative neurological ROS  negative psych ROS   GI/Hepatic Neg liver ROS, GERD  Controlled and Medicated,  Endo/Other  diabetes, Type 2, Oral Hypoglycemic AgentsMorbid obesity  Renal/GU negative Renal ROS  negative genitourinary   Musculoskeletal negative musculoskeletal ROS (+)   Abdominal (+) + obese,   Peds negative pediatric ROS (+)  Hematology negative hematology ROS (+)   Anesthesia Other Findings Obese  Reproductive/Obstetrics negative OB ROS                            Anesthesia Physical Anesthesia Plan  ASA: 3  Anesthesia Plan:    Post-op Pain Management:    Induction: Intravenous  PONV Risk Score and Plan: 2 and TIVA and Treatment may vary due to age or medical condition  Airway Management Planned: Natural Airway and Nasal Cannula  Additional Equipment:   Intra-op Plan:   Post-operative Plan:   Informed Consent: I have reviewed the patients History and Physical, chart, labs and discussed the procedure including the risks, benefits and alternatives for the proposed anesthesia with the patient or authorized representative who has indicated his/her understanding and acceptance.     Dental advisory given  Plan Discussed with: Anesthesiologist and CRNA  Anesthesia Plan Comments:        Anesthesia Quick Evaluation

## 2021-02-04 NOTE — Anesthesia Postprocedure Evaluation (Signed)
Anesthesia Post Note  Patient: Patricia Sherman  Procedure(s) Performed: COLONOSCOPY WITH PROPOFOL  Patient location during evaluation: PACU Anesthesia Type: General Level of consciousness: awake and alert Pain management: pain level controlled Vital Signs Assessment: post-procedure vital signs reviewed and stable Respiratory status: spontaneous breathing, nonlabored ventilation and respiratory function stable Cardiovascular status: blood pressure returned to baseline and stable Postop Assessment: no apparent nausea or vomiting Anesthetic complications: no   No notable events documented.   Last Vitals:  Vitals:   02/04/21 1116 02/04/21 1136  BP: 110/89 123/79  Pulse:  74  Resp:  (!) 21  Temp:    SpO2: 95% 100%    Last Pain:  Vitals:   02/04/21 1126  TempSrc:   PainSc: 0-No pain                 [place] Ouch

## 2021-02-04 NOTE — Transfer of Care (Signed)
Immediate Anesthesia Transfer of Care Note  Patient: Patricia Sherman  Procedure(s) Performed: COLONOSCOPY WITH PROPOFOL  Patient Location: PACU  Anesthesia Type:gen  Level of Consciousness: awake and sedated  Airway & Oxygen Therapy: Patient Spontanous Breathing and Patient connected to nasal cannula oxygen  Post-op Assessment: Report given to RN and Post -op Vital signs reviewed and stable  Post vital signs: Reviewed and stable  Last Vitals:  Vitals Value Taken Time  BP    Temp    Pulse    Resp    SpO2      Last Pain:  Vitals:   02/04/21 1027  TempSrc: Temporal  PainSc: 0-No pain         Complications: No notable events documented.

## 2021-02-04 NOTE — Op Note (Signed)
[facility] Patient Name: Patricia Sherman Procedure Date: 02/04/2021 10:09 AM MRN: [id number] Account #: [id number] Date of Birth: 1961/02/12 Admit Type: Outpatient Age: 60 Room: [facility] ENDO ROOM 1 Gender: Female Note Status: Finalized Instrument Name: Colonscope [id number] Procedure:             Colonoscopy Indications:           Screening for colorectal malignant neoplasm Providers:             Annamaria Helling DO, DO Referring MD:          Tracie Harrier Medicines:             Monitored Anesthesia Care Complications:         No immediate complications. Estimated blood loss:                         Minimal. Procedure:             Pre-Anesthesia Assessment:                        - Prior to the procedure, a History and Physical was                         performed, and patient medications and allergies were                         reviewed. The patient is competent. The risks and                         benefits of the procedure and the sedation options and                         risks were discussed with the patient. All questions                         were answered and informed consent was obtained.                         Patient identification and proposed procedure were                         verified by the physician, the nurse, the anesthetist                         and the technician in the endoscopy suite. Mental                         Status Examination: alert and oriented. Airway                         Examination: normal oropharyngeal airway and neck                         mobility. Respiratory Examination: clear to                         auscultation. CV Examination: RRR, no murmurs, no S3  or S4. Prophylactic Antibiotics: The patient does not                         require prophylactic antibiotics. Prior                         Anticoagulants: The patient has taken no previous                          anticoagulant or antiplatelet agents. ASA Grade                         Assessment: III - A patient with severe systemic                         disease. After reviewing the risks and benefits, the                         patient was deemed in satisfactory condition to                         undergo the procedure. The anesthesia plan was to use                         monitored anesthesia care (MAC). Immediately prior to                         administration of medications, the patient was                         re-assessed for adequacy to receive sedatives. The                         heart rate, respiratory rate, oxygen saturations,                         blood pressure, adequacy of pulmonary ventilation, and                         response to care were monitored throughout the                         procedure. The physical status of the patient was                         re-assessed after the procedure.                        After obtaining informed consent, the colonoscope was                         passed under direct vision. Throughout the procedure,                         the patient's blood pressure, pulse, and oxygen                         saturations were monitored continuously. The  Colonoscope was introduced through the anus and                         advanced to the the terminal ileum, with                         identification of the appendiceal orifice and IC                         valve. The colonoscopy was performed without                         difficulty. The patient tolerated the procedure well.                         The quality of the bowel preparation was evaluated                         using the BBPS [facility] Bowel Preparation Scale) with                         scores of: Right Colon = 3, Transverse Colon = 3 and                         Left Colon = 3 (entire mucosa seen well with no                         residual  staining, small fragments of stool or opaque                         liquid). The total BBPS score equals 9. The terminal                         ileum, ileocecal valve, appendiceal orifice, and                         rectum were photographed. Findings:      The perianal and digital rectal examinations were normal. Pertinent       negatives include normal sphincter tone.      The terminal ileum appeared normal.      Three sessile polyps were found in the rectum, sigmoid colon and       transverse colon. The polyps were 2 to 4 mm in size. These polyps were       removed with a jumbo cold forceps. Resection and retrieval were       complete. Estimated blood loss was minimal.      Non-bleeding internal hemorrhoids were found during retroflexion. The       hemorrhoids were Grade I (internal hemorrhoids that do not prolapse).      A few small-mouthed diverticula were found in the recto-sigmoid colon       and sigmoid colon.      The exam was otherwise without abnormality on direct and retroflexion       views. Impression:            - The examined portion of the ileum was normal.                        -  Three 2 to 4 mm polyps in the rectum, in the sigmoid                         colon and in the transverse colon, removed with a                         jumbo cold forceps. Resected and retrieved.                        - Non-bleeding internal hemorrhoids.                        - Diverticulosis in the recto-sigmoid colon and in the                         sigmoid colon.                        - The examination was otherwise normal on direct and                         retroflexion views. Recommendation:        - Discharge patient to home.                        - Resume previous diet.                        - Continue present medications.                        - Await pathology results.                        - Repeat colonoscopy for surveillance based on                         pathology  results.                        - Return to referring physician as previously                         scheduled. Procedure Code(s):     --- Professional ---                        [id number], Colonoscopy, flexible; with biopsy, single or                         multiple Diagnosis Code(s):     --- Professional ---                        Z12.11, Encounter for screening for malignant neoplasm                         of colon                        K64.0, First degree hemorrhoids  K62.1, Rectal polyp                        K63.5, Polyp of colon                        K57.30, Diverticulosis of large intestine without                         perforation or abscess without bleeding CPT copyright 2019 American Medical Association. All rights reserved. The codes documented in this report are preliminary and upon coder review may  be revised to meet current compliance requirements. Attending Participation:      I personally performed the entire procedure. Volney American, DO Annamaria Helling DO, DO 02/04/2021 11:05:58 AM This report has been signed electronically. Number of Addenda: 0 Note Initiated On: 02/04/2021 10:09 AM Scope Withdrawal Time: 0 hours 16 minutes 6 seconds  Total Procedure Duration: 0 hours 19 minutes 42 seconds  Estimated Blood Loss:  Estimated blood loss was minimal.      [facility]

## 2021-02-04 NOTE — H&P (Signed)
[facility] Pre-Procedure H&P   Patient ID: Patricia Sherman is a 60 y.o. female.  Gastroenterology Provider: Annamaria Helling, DO  Referring Provider: Dr. Ginette Pitman PCP: Tracie Harrier, MD  Date: 02/04/2021  HPI Ms. Patricia Sherman is a 60 y.o. female who presents today for Colonoscopy for initial screening colonoscopy.  Nomral qd bm w/o blood/melena. No diarrhea/constipation. Hgb 14.8 mcv 85. No fhx polyps/crc  Patient denies nausea, vomiting, coffee ground emesis, hematemesis, abdominal pain, diarrhea, constipation, melena, hematochezia, fever, chills, dysphagia, odynophagia, jaundice, heartburn/reflux.  Past Medical History:  Diagnosis Date   Diabetes mellitus without complication ([place])    Environmental allergies    Hyperlipidemia    Hypertension    Uterine leiomyoma 02/09/2016    Past Surgical History:  Procedure Laterality Date   CATARACT EXTRACTION     TONSILLECTOMY     TUBAL LIGATION      Family History No h/o GI disease or malignancy  Review of Systems  Constitutional:  Negative for activity change, appetite change, fatigue, fever and unexpected weight change.  HENT:  Negative for trouble swallowing and voice change.   Respiratory:  Negative for shortness of breath and wheezing.   Cardiovascular:  Negative for chest pain and palpitations.  Gastrointestinal:  Negative for abdominal distention, abdominal pain, anal bleeding, blood in stool, constipation, diarrhea, nausea, rectal pain and vomiting.  Musculoskeletal:  Negative for arthralgias and myalgias.  Skin:  Negative for color change and pallor.  Neurological:  Negative for dizziness, syncope and weakness.  Psychiatric/Behavioral:  Negative for confusion.   All other systems reviewed and are negative.   Medications No current facility-administered medications on file prior to encounter.   Current Outpatient Medications on File Prior to Encounter  Medication Sig Dispense Refill    atorvastatin (LIPITOR) 10 MG tablet Take 10 mg daily by mouth.     atorvastatin (LIPITOR) 10 MG tablet TAKE 1 TABLET (10 MG TOTAL) BY MOUTH ONCE DAILY 90 tablet 3   fexofenadine-pseudoephedrine (ALLEGRA-D) 60-120 MG 12 hr tablet Take 1 tablet by mouth 2 (two) times daily. 20 tablet 0   levocetirizine (XYZAL) 5 MG tablet Take 1 tablet (5 mg total) by mouth every evening 90 tablet 1   loratadine (CLARITIN) 10 MG tablet Take 10 mg by mouth daily.     losartan (COZAAR) 25 MG tablet TAKE 1 TABLET (25 MG TOTAL) BY MOUTH ONCE DAILY 90 tablet 1   Multiple Vitamin (MULTIVITAMIN) tablet Take 1 tablet by mouth daily.     nystatin (MYCOSTATIN) 100000 UNIT/ML suspension Use as directed 5 mLs (500,000 Units total) in the mouth or throat 4 (four) times daily. Swish around in mouth, then gargle, then swallow 60 mL 0   nystatin cream (MYCOSTATIN) Apply 1 application topically 2 (two) times daily. Treat for 1 week, or until rash clears. 30 g 1   omeprazole (PRILOSEC) 20 MG capsule Take 1 capsule (20 mg total) by mouth daily. 90 capsule 3   Semaglutide,0.25 or 0.5MG /DOS, 2 MG/1.5ML SOPN INJECT 0.25 MG UNDER THE SKIN EVERY WEEK FOR 4 WEEKS, THEN INCREASE TO 0.5 MG WEEKLY AFTER THAT 1.5 mL 5   Zoster Vaccine Adjuvanted [facility]) injection Inject 0.5 mL into the muscle once for 1 dose (Patient not taking: Reported on 11/10/2020) 1 each 1    Pertinent medications related to GI and procedure were reviewed by me with the patient prior to the procedure   Current Facility-Administered Medications:    0.9 %  sodium chloride infusion, , Intravenous, Continuous, Virgina Jock,  Richardo Hanks, DO, Last Rate: 20 mL/hr at 02/04/21 1025, New Bag at 02/04/21 1025  sodium chloride 20 mL/hr at 02/04/21 1025       Allergies  Allergen Reactions   Metformin Diarrhea and Other (See Comments)    Other reaction(s): Other (See Comments) Elevated heart rate Elevated heart rate   Sulfa Antibiotics Rash   Allergies were reviewed by me  prior to the procedure  Objective    Vitals:   02/04/21 1027  BP: (!) 173/89  Pulse: (!) 109  Resp: 20  Temp: 97.6 F (36.4 C)  TempSrc: Temporal  SpO2: 100%  Weight: 109.3 kg  Height: 5\' 2"  (1.575 m)     Physical Exam Vitals reviewed.  Constitutional:      General: She is not in acute distress.    Appearance: Normal appearance. She is obese. She is not ill-appearing, toxic-appearing or diaphoretic.  HENT:     Head: Normocephalic and atraumatic.     Nose: Nose normal.     Mouth/Throat:     Mouth: Mucous membranes are moist.     Pharynx: Oropharynx is clear.  Eyes:     General: No scleral icterus.    Extraocular Movements: Extraocular movements intact.  Cardiovascular:     Rate and Rhythm: Regular rhythm. Tachycardia present.     Heart sounds: Normal heart sounds. No murmur heard.   No friction rub. No gallop.  Pulmonary:     Effort: Pulmonary effort is normal. No respiratory distress.     Breath sounds: Normal breath sounds. No wheezing, rhonchi or rales.  Abdominal:     General: Bowel sounds are normal. There is no distension.     Palpations: Abdomen is soft.     Tenderness: There is no abdominal tenderness. There is no guarding or rebound.  Musculoskeletal:     Cervical back: Neck supple.     Right lower leg: No edema.     Left lower leg: No edema.  Skin:    General: Skin is warm and dry.     Coloration: Skin is not jaundiced or pale.  Neurological:     General: No focal deficit present.     Mental Status: She is alert and oriented to person, place, and time. Mental status is at baseline.  Psychiatric:        Mood and Affect: Mood normal.        Behavior: Behavior normal.        Thought Content: Thought content normal.        Judgment: Judgment normal.     Assessment:  Ms. Patricia Sherman is a 60 y.o. female  who presents today for Colonoscopy for initial screening colonoscopy  Plan:  Colonoscopy with possible intervention today  Colonoscopy  with possible biopsy, control of bleeding, polypectomy, and interventions as necessary has been discussed with the patient/patient representative. Informed consent was obtained from the patient/patient representative after explaining the indication, nature, and risks of the procedure including but not limited to death, bleeding, perforation, missed neoplasm/lesions, cardiorespiratory compromise, and reaction to medications. Opportunity for questions was given and appropriate answers were provided. Patient/patient representative has verbalized understanding is amenable to undergoing the procedure.   Annamaria Helling, DO  [facility]  Portions of the record may have been created with voice recognition software. Occasional wrong-word or 'sound-a-like' substitutions may have occurred due to the inherent limitations of voice recognition software.  Read the chart carefully and recognize, using context, where substitutions may have occurred.

## 2021-02-05 ENCOUNTER — Encounter: Payer: Self-pay | Admitting: Gastroenterology

## 2021-02-05 LAB — SURGICAL PATHOLOGY

## 2021-02-19 ENCOUNTER — Other Ambulatory Visit: Payer: Self-pay

## 2021-02-22 ENCOUNTER — Other Ambulatory Visit: Payer: Self-pay

## 2021-02-23 ENCOUNTER — Other Ambulatory Visit: Payer: Self-pay

## 2021-02-24 ENCOUNTER — Other Ambulatory Visit: Payer: Self-pay

## 2021-02-24 MED ORDER — FLUCONAZOLE 150 MG PO TABS
150.0000 mg | ORAL_TABLET | Freq: Once | ORAL | 3 refills | Status: AC
  Filled 2021-02-24: qty 1, 1d supply, fill #0

## 2021-03-09 ENCOUNTER — Other Ambulatory Visit: Payer: Self-pay

## 2021-03-30 DIAGNOSIS — R8290 Unspecified abnormal findings in urine: Secondary | ICD-10-CM | POA: Diagnosis not present

## 2021-03-30 DIAGNOSIS — E1165 Type 2 diabetes mellitus with hyperglycemia: Secondary | ICD-10-CM | POA: Diagnosis not present

## 2021-03-30 DIAGNOSIS — M1712 Unilateral primary osteoarthritis, left knee: Secondary | ICD-10-CM | POA: Diagnosis not present

## 2021-03-30 DIAGNOSIS — Z0000 Encounter for general adult medical examination without abnormal findings: Secondary | ICD-10-CM | POA: Diagnosis not present

## 2021-03-30 DIAGNOSIS — I10 Essential (primary) hypertension: Secondary | ICD-10-CM | POA: Diagnosis not present

## 2021-04-05 ENCOUNTER — Other Ambulatory Visit: Payer: Self-pay

## 2021-04-05 DIAGNOSIS — Z6841 Body Mass Index (BMI) 40.0 and over, adult: Secondary | ICD-10-CM | POA: Diagnosis not present

## 2021-04-05 DIAGNOSIS — I10 Essential (primary) hypertension: Secondary | ICD-10-CM | POA: Diagnosis not present

## 2021-04-05 DIAGNOSIS — E1165 Type 2 diabetes mellitus with hyperglycemia: Secondary | ICD-10-CM | POA: Diagnosis not present

## 2021-04-05 DIAGNOSIS — M1712 Unilateral primary osteoarthritis, left knee: Secondary | ICD-10-CM | POA: Diagnosis not present

## 2021-04-05 MED ORDER — ATORVASTATIN CALCIUM 10 MG PO TABS
10.0000 mg | ORAL_TABLET | Freq: Every day | ORAL | 3 refills | Status: DC
  Filled 2021-04-05: qty 90, 90d supply, fill #0

## 2021-04-05 MED ORDER — FEXOFENADINE HCL 180 MG PO TABS
ORAL_TABLET | ORAL | 3 refills | Status: AC
  Filled 2021-04-05: qty 90, 90d supply, fill #0
  Filled 2021-08-31: qty 90, 90d supply, fill #1
  Filled 2021-12-03: qty 90, 90d supply, fill #2
  Filled 2022-02-28: qty 90, 90d supply, fill #3

## 2021-04-05 MED ORDER — OMEPRAZOLE 20 MG PO CPDR: DELAYED_RELEASE_CAPSULE | ORAL | 3 refills | Status: DC

## 2021-04-05 MED ORDER — LOSARTAN POTASSIUM 50 MG PO TABS
50.0000 mg | ORAL_TABLET | Freq: Every day | ORAL | 11 refills | Status: DC
  Filled 2021-04-05: qty 30, 30d supply, fill #0
  Filled 2021-06-30: qty 30, 30d supply, fill #1
  Filled 2021-08-31: qty 30, 30d supply, fill #2

## 2021-04-05 MED ORDER — FARXIGA 10 MG PO TABS
ORAL_TABLET | ORAL | 3 refills | Status: DC
  Filled 2021-04-30: qty 90, 90d supply, fill #0
  Filled 2021-08-02: qty 90, 90d supply, fill #1

## 2021-04-05 MED ORDER — GLIMEPIRIDE 4 MG PO TABS
ORAL_TABLET | ORAL | 3 refills | Status: DC
  Filled 2021-04-05: qty 180, 90d supply, fill #0
  Filled 2021-10-19: qty 180, 90d supply, fill #1

## 2021-04-05 MED ORDER — JANUVIA 100 MG PO TABS
ORAL_TABLET | ORAL | 3 refills | Status: DC
  Filled 2021-04-05: qty 30, 30d supply, fill #0
  Filled 2021-04-30: qty 30, 30d supply, fill #1
  Filled 2021-06-08: qty 30, 30d supply, fill #2
  Filled 2021-07-08: qty 30, 30d supply, fill #3
  Filled 2021-08-02: qty 30, 30d supply, fill #4

## 2021-04-06 ENCOUNTER — Other Ambulatory Visit: Payer: Self-pay

## 2021-04-21 ENCOUNTER — Other Ambulatory Visit: Payer: Self-pay

## 2021-04-21 MED ORDER — IBUPROFEN 800 MG PO TABS
ORAL_TABLET | ORAL | 0 refills | Status: DC
  Filled 2021-04-21: qty 30, 7d supply, fill #0

## 2021-04-23 ENCOUNTER — Other Ambulatory Visit: Payer: Self-pay

## 2021-04-23 MED ORDER — AMOXICILLIN 500 MG PO CAPS
ORAL_CAPSULE | ORAL | 0 refills | Status: DC
  Filled 2021-04-23: qty 30, 10d supply, fill #0

## 2021-04-30 ENCOUNTER — Other Ambulatory Visit: Payer: Self-pay

## 2021-05-03 ENCOUNTER — Other Ambulatory Visit: Payer: Self-pay

## 2021-05-28 ENCOUNTER — Other Ambulatory Visit: Payer: Self-pay

## 2021-05-28 MED ORDER — FLUCONAZOLE 150 MG PO TABS
150.0000 mg | ORAL_TABLET | Freq: Once | ORAL | 0 refills | Status: DC
  Filled 2021-05-28: qty 1, 1d supply, fill #0

## 2021-05-28 MED ORDER — AMOXICILLIN 875 MG PO TABS
ORAL_TABLET | ORAL | 0 refills | Status: DC
  Filled 2021-05-28: qty 20, 10d supply, fill #0

## 2021-06-08 ENCOUNTER — Other Ambulatory Visit: Payer: Self-pay

## 2021-06-30 ENCOUNTER — Other Ambulatory Visit: Payer: Self-pay

## 2021-06-30 DIAGNOSIS — Z6841 Body Mass Index (BMI) 40.0 and over, adult: Secondary | ICD-10-CM | POA: Diagnosis not present

## 2021-06-30 DIAGNOSIS — R8290 Unspecified abnormal findings in urine: Secondary | ICD-10-CM | POA: Diagnosis not present

## 2021-06-30 DIAGNOSIS — I10 Essential (primary) hypertension: Secondary | ICD-10-CM | POA: Diagnosis not present

## 2021-06-30 DIAGNOSIS — E1165 Type 2 diabetes mellitus with hyperglycemia: Secondary | ICD-10-CM | POA: Diagnosis not present

## 2021-06-30 DIAGNOSIS — M1712 Unilateral primary osteoarthritis, left knee: Secondary | ICD-10-CM | POA: Diagnosis not present

## 2021-07-07 ENCOUNTER — Other Ambulatory Visit: Payer: Self-pay

## 2021-07-07 DIAGNOSIS — I10 Essential (primary) hypertension: Secondary | ICD-10-CM | POA: Diagnosis not present

## 2021-07-07 DIAGNOSIS — E1165 Type 2 diabetes mellitus with hyperglycemia: Secondary | ICD-10-CM | POA: Diagnosis not present

## 2021-07-07 DIAGNOSIS — E7800 Pure hypercholesterolemia, unspecified: Secondary | ICD-10-CM | POA: Diagnosis not present

## 2021-07-07 MED ORDER — OZEMPIC (0.25 OR 0.5 MG/DOSE) 2 MG/1.5ML ~~LOC~~ SOPN
PEN_INJECTOR | SUBCUTANEOUS | 5 refills | Status: DC
  Filled 2021-07-07: qty 1.5, 28d supply, fill #0
  Filled 2021-10-04: qty 1.5, 28d supply, fill #1

## 2021-07-07 MED ORDER — OMEPRAZOLE 20 MG PO CPDR
DELAYED_RELEASE_CAPSULE | ORAL | 3 refills | Status: DC
  Filled 2021-07-07: qty 180, 90d supply, fill #0

## 2021-07-07 MED ORDER — LOSARTAN POTASSIUM 25 MG PO TABS
25.0000 mg | ORAL_TABLET | Freq: Every day | ORAL | 3 refills | Status: DC
  Filled 2021-07-07: qty 90, 90d supply, fill #0
  Filled 2021-09-28 (×2): qty 90, 90d supply, fill #1

## 2021-07-07 MED ORDER — FARXIGA 10 MG PO TABS
ORAL_TABLET | ORAL | 3 refills | Status: DC
  Filled 2021-07-07 – 2021-10-25 (×2): qty 90, 90d supply, fill #0

## 2021-07-07 MED ORDER — GLIMEPIRIDE 4 MG PO TABS
ORAL_TABLET | ORAL | 3 refills | Status: DC
  Filled 2021-07-07: qty 180, 90d supply, fill #0

## 2021-07-07 MED ORDER — ATORVASTATIN CALCIUM 10 MG PO TABS
10.0000 mg | ORAL_TABLET | Freq: Every day | ORAL | 3 refills | Status: DC
  Filled 2021-07-07: qty 90, 90d supply, fill #0

## 2021-07-08 ENCOUNTER — Other Ambulatory Visit: Payer: Self-pay

## 2021-08-02 ENCOUNTER — Other Ambulatory Visit: Payer: Self-pay

## 2021-08-31 ENCOUNTER — Other Ambulatory Visit: Payer: Self-pay

## 2021-08-31 MED ORDER — AMOXICILLIN 875 MG PO TABS
ORAL_TABLET | ORAL | 0 refills | Status: DC
  Filled 2021-08-31: qty 20, 10d supply, fill #0

## 2021-09-01 ENCOUNTER — Other Ambulatory Visit: Payer: Self-pay

## 2021-09-02 ENCOUNTER — Other Ambulatory Visit: Payer: Self-pay

## 2021-09-02 MED ORDER — FREESTYLE FREEDOM LITE W/DEVICE KIT
PACK | 0 refills | Status: DC
  Filled 2021-09-02: qty 1, 30d supply, fill #0

## 2021-09-02 MED ORDER — FREESTYLE LITE TEST VI STRP
ORAL_STRIP | 1 refills | Status: DC
  Filled 2021-09-02: qty 300, 90d supply, fill #0
  Filled 2021-12-24: qty 300, 90d supply, fill #1

## 2021-09-02 MED ORDER — FREESTYLE LITE TEST VI STRP
ORAL_STRIP | 0 refills | Status: DC
  Filled 2021-09-02: qty 200, 90d supply, fill #0

## 2021-09-02 MED ORDER — FREESTYLE LANCETS MISC
3 refills | Status: AC
  Filled 2021-09-02: qty 200, 90d supply, fill #0
  Filled 2021-12-24: qty 200, 90d supply, fill #1

## 2021-09-06 ENCOUNTER — Other Ambulatory Visit: Payer: Self-pay

## 2021-09-06 MED ORDER — FLUTICASONE PROPIONATE 50 MCG/ACT NA SUSP
NASAL | 3 refills | Status: DC
  Filled 2021-09-06: qty 16, 30d supply, fill #0

## 2021-09-07 ENCOUNTER — Other Ambulatory Visit: Payer: Self-pay

## 2021-09-09 ENCOUNTER — Other Ambulatory Visit: Payer: Self-pay

## 2021-09-28 ENCOUNTER — Other Ambulatory Visit: Payer: Self-pay

## 2021-09-29 ENCOUNTER — Other Ambulatory Visit: Payer: Self-pay

## 2021-10-04 ENCOUNTER — Other Ambulatory Visit: Payer: Self-pay

## 2021-10-04 MED ORDER — OZEMPIC (0.25 OR 0.5 MG/DOSE) 2 MG/3ML ~~LOC~~ SOPN
0.2500 mg | PEN_INJECTOR | SUBCUTANEOUS | 4 refills | Status: DC
  Filled 2021-10-04: qty 3, 28d supply, fill #0
  Filled 2021-11-19: qty 3, 28d supply, fill #1
  Filled 2021-12-24 – 2021-12-27 (×2): qty 3, 28d supply, fill #2

## 2021-10-05 DIAGNOSIS — Z6841 Body Mass Index (BMI) 40.0 and over, adult: Secondary | ICD-10-CM | POA: Diagnosis not present

## 2021-10-05 DIAGNOSIS — M170 Bilateral primary osteoarthritis of knee: Secondary | ICD-10-CM | POA: Diagnosis not present

## 2021-10-05 DIAGNOSIS — E7800 Pure hypercholesterolemia, unspecified: Secondary | ICD-10-CM | POA: Diagnosis not present

## 2021-10-05 DIAGNOSIS — I10 Essential (primary) hypertension: Secondary | ICD-10-CM | POA: Diagnosis not present

## 2021-10-05 DIAGNOSIS — E1165 Type 2 diabetes mellitus with hyperglycemia: Secondary | ICD-10-CM | POA: Diagnosis not present

## 2021-10-11 ENCOUNTER — Other Ambulatory Visit: Payer: Self-pay

## 2021-10-11 DIAGNOSIS — Z1231 Encounter for screening mammogram for malignant neoplasm of breast: Secondary | ICD-10-CM | POA: Diagnosis not present

## 2021-10-11 DIAGNOSIS — E1165 Type 2 diabetes mellitus with hyperglycemia: Secondary | ICD-10-CM | POA: Diagnosis not present

## 2021-10-11 DIAGNOSIS — E7800 Pure hypercholesterolemia, unspecified: Secondary | ICD-10-CM | POA: Diagnosis not present

## 2021-10-11 DIAGNOSIS — M170 Bilateral primary osteoarthritis of knee: Secondary | ICD-10-CM | POA: Diagnosis not present

## 2021-10-11 DIAGNOSIS — R232 Flushing: Secondary | ICD-10-CM | POA: Diagnosis not present

## 2021-10-11 DIAGNOSIS — Z6841 Body Mass Index (BMI) 40.0 and over, adult: Secondary | ICD-10-CM | POA: Diagnosis not present

## 2021-10-11 DIAGNOSIS — R110 Nausea: Secondary | ICD-10-CM | POA: Diagnosis not present

## 2021-10-11 DIAGNOSIS — I10 Essential (primary) hypertension: Secondary | ICD-10-CM | POA: Diagnosis not present

## 2021-10-11 MED ORDER — AMLODIPINE BESYLATE 2.5 MG PO TABS
ORAL_TABLET | ORAL | 1 refills | Status: DC
  Filled 2021-10-11: qty 90, 90d supply, fill #0

## 2021-10-11 MED ORDER — ONDANSETRON HCL 4 MG PO TABS
ORAL_TABLET | ORAL | 3 refills | Status: AC
  Filled 2021-10-11: qty 20, 7d supply, fill #0

## 2021-10-12 ENCOUNTER — Other Ambulatory Visit: Payer: Self-pay | Admitting: Internal Medicine

## 2021-10-12 DIAGNOSIS — Z1231 Encounter for screening mammogram for malignant neoplasm of breast: Secondary | ICD-10-CM

## 2021-10-19 ENCOUNTER — Other Ambulatory Visit: Payer: Self-pay

## 2021-10-25 ENCOUNTER — Other Ambulatory Visit: Payer: Self-pay

## 2021-11-05 ENCOUNTER — Ambulatory Visit
Admission: RE | Admit: 2021-11-05 | Discharge: 2021-11-05 | Disposition: A | Discharge status: Home / Self Care | Payer: 59 | Source: Ambulatory Visit | Attending: Internal Medicine | Admitting: Internal Medicine

## 2021-11-05 DIAGNOSIS — Z1231 Encounter for screening mammogram for malignant neoplasm of breast: Secondary | ICD-10-CM | POA: Diagnosis not present

## 2021-11-19 ENCOUNTER — Other Ambulatory Visit: Payer: Self-pay

## 2021-11-25 ENCOUNTER — Encounter: Payer: Self-pay | Admitting: Obstetrics and Gynecology

## 2021-11-25 ENCOUNTER — Ambulatory Visit (INDEPENDENT_AMBULATORY_CARE_PROVIDER_SITE_OTHER): Payer: 59 | Admitting: Obstetrics and Gynecology

## 2021-11-25 VITALS — BP 148/86 | HR 86 | Resp 16 | Ht 63.0 in | Wt 234.7 lb

## 2021-11-25 DIAGNOSIS — I10 Essential (primary) hypertension: Secondary | ICD-10-CM | POA: Diagnosis not present

## 2021-11-25 DIAGNOSIS — Z01419 Encounter for gynecological examination (general) (routine) without abnormal findings: Secondary | ICD-10-CM | POA: Diagnosis not present

## 2021-11-25 DIAGNOSIS — L74513 Primary focal hyperhidrosis, soles: Secondary | ICD-10-CM

## 2021-11-25 DIAGNOSIS — N812 Incomplete uterovaginal prolapse: Secondary | ICD-10-CM | POA: Diagnosis not present

## 2021-11-25 DIAGNOSIS — E119 Type 2 diabetes mellitus without complications: Secondary | ICD-10-CM

## 2021-11-25 NOTE — Progress Notes (Signed)
ANNUAL PREVENTATIVE CARE GYNECOLOGY  ENCOUNTER NOTE  Subjective:       Patricia Sherman is a 61 y.o. [phone] female here for a routine annual gynecologic exam. The patient is not sexually active. The patient is not taking hormone replacement therapy. Patient denies post-menopausal vaginal bleeding. The patient wears seatbelts: yes. The patient participates in regular exercise: no . Has the patient ever been transfused or tattooed?: no. The patient reports that there is not domestic violence in her life.   Patricia Sherman complains of sweating and itching of her feet, especially when wearing shoes. Has tried to use Athlete's foot cream but does not feel like it is helping.   She also reports that she has been losing weight since being placed on Ozempic.  Has been able to discontinue several of her BP meds.   Gynecologic History No LMP recorded. Patient is postmenopausal. Contraception: post menopausal status Last Pap: 11/10/20. Results were: normal Last mammogram: 11/05/21. Results were: normal Last Colonoscopy: 02/04/2021. Results were: normal.  Last Dexa Scan: Never done   Obstetric History OB History  Gravida Para Term Preterm AB Living  _0 SAB IAB Ectopic Multiple Live Births    0     2    # Outcome Date GA Lbr Len/2nd Weight Sex Delivery Anes PTL Lv  5 Term 1992   7 lb 6.4 oz (3.357 kg) F Vag-Spont   LIV  4 Term 1985   6 lb 11.2 oz (3.039 kg) F Vag-Spont   LIV  3 AB           2 AB           1 AB             Past Medical History:  Diagnosis Date   Diabetes mellitus without complication ([place])    Environmental allergies    Hyperlipidemia    Hypertension    Uterine leiomyoma 02/09/2016    Family History  Problem Relation Age of Onset   Heart disease Mother    Hypertension Mother    Diabetes Sister    Hypertension Sister    Heart disease Brother    Hypertension Brother    Diabetes Daughter    Breast cancer Neg Hx    Ovarian cancer Neg Hx    Colon cancer Neg  Hx     Past Surgical History:  Procedure Laterality Date   CATARACT EXTRACTION     COLONOSCOPY WITH PROPOFOL N/A 02/04/2021   Procedure: COLONOSCOPY WITH PROPOFOL;  Surgeon: Annamaria Helling, DO;  [facility];  Service: Gastroenterology;  Laterality: N/A;   TONSILLECTOMY     TUBAL LIGATION      Social History   Socioeconomic History   Marital status: Single    Spouse name: Not on file   Number of children: Not on file   Years of education: Not on file   Highest education level: Not on file  Occupational History   Not on file  Tobacco Use   Smoking status: Never   Smokeless tobacco: Never  Vaping Use   Vaping Use: Never used  Substance and Sexual Activity   Alcohol use: No    Alcohol/week: 0.0 standard drinks of alcohol   Drug use: No   Sexual activity: Not Currently    Partners: Male    Birth control/protection: Post-menopausal  Other Topics Concern   Not on file  Social History Narrative   Not on file  Social Determinants of Health   Financial Resource Strain: Not on file  Food Insecurity: Not on file  Transportation Needs: Not on file  Physical Activity: Inactive (11/07/2018)   Exercise Vital Sign    Days of Exercise per Week: 0 days    Minutes of Exercise per Session: 0 min  Stress: No Stress Concern Present (11/07/2018)   [place]    Feeling of Stress : Not at all  Social Connections: Not on file  Intimate Partner Violence: Not At Risk (11/07/2018)   Humiliation, Afraid, Rape, and Kick questionnaire    Fear of Current or Ex-Partner: No    Emotionally Abused: No    Physically Abused: No    Sexually Abused: No    Current Outpatient Medications on File Prior to Visit  Medication Sig Dispense Refill   amLODipine (NORVASC) 2.5 MG tablet Take 1 tablet (2.5 mg total) by mouth once daily 90 tablet 1   amoxicillin (AMOXIL) 500 MG capsule Take one capsule by mouth 3 times a day for  10 days 30 capsule 0   amoxicillin (AMOXIL) 875 MG tablet Take 1 tablet (875 mg total) by mouth every 12 (twelve) hours for 10 days 20 tablet 0   amoxicillin (AMOXIL) 875 MG tablet Take 1 tablet by mouth 2 times daily for 10 days 20 tablet 0   atorvastatin (LIPITOR) 10 MG tablet Take 10 mg daily by mouth.     atorvastatin (LIPITOR) 10 MG tablet Take 1 tablet (10 mg total) by mouth once daily 90 tablet 3   atorvastatin (LIPITOR) 10 MG tablet Take 1 tablet (10 mg total) by mouth once daily 90 tablet 3   Blood Glucose Monitoring Suppl (FREESTYLE FREEDOM LITE) w/Device KIT Use as directed to test twice daily 1 kit 0   cephALEXin (KEFLEX) 500 MG capsule Take 1 capsule (500 mg total) by mouth 2 (two) times daily. 20 capsule 0   dapagliflozin propanediol (FARXIGA) 10 MG TABS tablet Take 1 tablet (10 mg total) by mouth every morning 90 tablet 3   dapagliflozin propanediol (FARXIGA) 10 MG TABS tablet Take 1 tablet (10 mg total) by mouth every morning 90 tablet 3   dapagliflozin propanediol (FARXIGA) 10 MG TABS tablet Take 1 tablet (10 mg total) by mouth every morning 90 tablet 3   fexofenadine (ALLERGY 24-HR) 180 MG tablet Take 1 tablet (180 mg total) by mouth once daily 90 tablet 3   fexofenadine-pseudoephedrine (ALLEGRA-D) 60-120 MG 12 hr tablet Take 1 tablet by mouth 2 (two) times daily. 20 tablet 0   fluticasone (FLONASE) 50 MCG/ACT nasal spray Place 2 sprays into both nostrils once daily 16 g 3   glimepiride (AMARYL) 4 MG tablet TAKE 1 TABLET BY MOUTH TWICE DAILY 180 tablet 1   glimepiride (AMARYL) 4 MG tablet Take 1 tablet (4 mg total) by mouth 2 (two) times daily 180 tablet 3   glimepiride (AMARYL) 4 MG tablet Take 1 tablet (4 mg total) by mouth 2 (two) times daily 180 tablet 3   glucose blood (FREESTYLE LITE) test strip Use as directed to test blood sugar two times daily 200 each 0   glucose blood (FREESTYLE LITE) test strip Use three times daily as directed 300 each 1   ibuprofen (ADVIL) 800 MG  tablet take one tablet by mouth every 6 to 8 hours as needed for pain 30 tablet 0   Lancets (FREESTYLE) lancets Use 1 each 2 (two) times daily 200 each 3  levocetirizine (XYZAL) 5 MG tablet Take 1 tablet (5 mg total) by mouth every evening 90 tablet 1   loratadine (CLARITIN) 10 MG tablet Take 10 mg by mouth daily.     losartan (COZAAR) 25 MG tablet TAKE 1 TABLET (25 MG TOTAL) BY MOUTH ONCE DAILY 90 tablet 1   losartan (COZAAR) 25 MG tablet Take 1 tablet (25 mg total) by mouth once daily 90 tablet 3   losartan (COZAAR) 50 MG tablet Take 1 tablet (50 mg total) by mouth once daily 30 tablet 11   losartan (COZAAR) 50 MG tablet Take 1 tablet (50 mg total) by mouth once daily 30 tablet 11   Multiple Vitamin (MULTIVITAMIN) tablet Take 1 tablet by mouth daily.     nystatin (MYCOSTATIN) 100000 UNIT/ML suspension Use as directed 5 mLs (500,000 Units total) in the mouth or throat 4 (four) times daily. Swish around in mouth, then gargle, then swallow 60 mL 0   nystatin cream (MYCOSTATIN) Apply 1 application topically 2 (two) times daily. Treat for 1 week, or until rash clears. 30 g 1   omeprazole (PRILOSEC) 20 MG capsule Take 1 capsule (20 mg total) by mouth daily. 90 capsule 3   omeprazole (PRILOSEC) 20 MG capsule Take 1 capsule (20 mg total) by mouth 2 (two) times daily before meals 180 capsule 3   omeprazole (PRILOSEC) 20 MG capsule Take 1 capsule (20 mg total) by mouth 2 (two) times daily before meals 180 capsule 3   omeprazole (PRILOSEC) 20 MG capsule Take 1 capsule (20 mg total) by mouth 2 (two) times daily before meals 180 capsule 3   ondansetron (ZOFRAN) 4 MG tablet Take 1 tablet (4 mg total) by mouth every 8 (eight) hours as needed for Nausea for up to 27 days 20 tablet 3   pioglitazone (ACTOS) 30 MG tablet Take 1 tablet (30 mg total) by mouth once daily 30 tablet 5   pioglitazone (ACTOS) 30 MG tablet Take 1 tablet (30 mg total) by mouth once daily 30 tablet 5   polyethylene glycol-electrolytes  (NULYTELY) 420 g solution Use as directed for colon prep 4000 mL 0   Semaglutide,0.25 or 0.5MG/DOS, (OZEMPIC, 0.25 OR 0.5 MG/DOSE,) 2 MG/3ML SOPN Inject 0.25 mg into the skin once a week. 3 mL 4   sitaGLIPtin (JANUVIA) 100 MG tablet TAKE 1 TABLET BY MOUTH ONCE DAILY 90 tablet 3   sitaGLIPtin (JANUVIA) 100 MG tablet Take 1 tablet (100 mg total) by mouth once daily 90 tablet 3   Zoster Vaccine Adjuvanted [facility]) injection Inject 0.5 mL into the muscle once for 1 dose (Patient not taking: Reported on 11/10/2020) 1 each 1   No current facility-administered medications on file prior to visit.    Allergies  Allergen Reactions   Metformin Diarrhea and Other (See Comments)    Other reaction(s): Other (See Comments) Elevated heart rate Elevated heart rate   Sulfa Antibiotics Rash      Review of Systems ROS Review of Systems - General ROS: negative for - chills, fatigue, fever, hot flashes, night sweats, weight gain or weight loss Psychological ROS: negative for - anxiety, decreased libido, depression, mood swings, physical abuse or sexual abuse Ophthalmic ROS: negative for - blurry vision, eye pain or loss of vision ENT ROS: negative for - headaches, hearing change, visual changes or vocal changes Allergy and Immunology ROS: negative for - hives, itchy/watery eyes or seasonal allergies Hematological and Lymphatic ROS: negative for - bleeding problems, bruising, swollen lymph nodes or weight loss  Endocrine ROS: negative for - galactorrhea, hair pattern changes, hot flashes, malaise/lethargy, mood swings, palpitations, polydipsia/polyuria, skin changes, temperature intolerance or unexpected weight changes Breast ROS: negative for - new or changing breast lumps or nipple discharge Respiratory ROS: negative for - cough or shortness of breath Cardiovascular ROS: negative for - chest pain, irregular heartbeat, palpitations or shortness of breath Gastrointestinal ROS: no abdominal pain, change in  bowel habits, or black or bloody stools Genito-Urinary ROS: no dysuria, trouble voiding, or hematuria Musculoskeletal ROS: negative for - joint pain or joint stiffness Neurological ROS: negative for - bowel and bladder control changes Dermatological ROS: negative for rash and skin lesion changes. Positive for sweating and itching of feet.    Objective:   BP (!) 148/86   Pulse 86   Resp 16   Ht _0  (1.6 m)   Wt 234 lb 11.2 oz (106.5 kg)   BMI 41.58 kg/m  CONSTITUTIONAL: Well-developed, well-nourished female in no acute distress.  PSYCHIATRIC: Normal mood and affect. Normal behavior. Normal judgment and thought content. [place]: Alert and oriented to person, place, and time. Normal muscle tone coordination. No cranial nerve deficit noted. HENT:  Normocephalic, atraumatic, External right and left ear normal. Oropharynx is clear and moist EYES: Conjunctivae and EOM are normal. Pupils are equal, round, and reactive to light. No scleral icterus.  NECK: Normal range of motion, supple, no masses.  Normal thyroid.  SKIN: Skin is warm and dry. No rash noted. Not diaphoretic. No erythema. No pallor. CARDIOVASCULAR: Normal heart rate noted, regular rhythm, no murmur. RESPIRATORY: Clear to auscultation bilaterally. Effort and breath sounds normal, no problems with respiration noted. BREASTS: Symmetric in size. No masses, skin changes, nipple drainage, or lymphadenopathy. ABDOMEN: Soft, normal bowel sounds, no distention noted.  No tenderness, rebound or guarding.  BLADDER: Normal PELVIC:  Bladder no bladder distension noted  Urethra: normal appearing urethra with no masses, tenderness or lesions  Vulva: normal appearing vulva with no masses, tenderness or lesions  Vagina: mildly atrophic, no lesions or discharge  Cervix: normal appearing cervix without discharge or lesions, 2nd-3rd degree prolapse  Uterus: uterus is normal size, shape, consistency and nontender  Adnexa: normal adnexa in  size, nontender and no masses  RV: External Exam NormaI, No Rectal Masses, and Normal Sphincter tone  MUSCULOSKELETAL: Normal range of motion. No tenderness.  No cyanosis, clubbing, or edema.  2+ distal pulses. LYMPHATIC: No Axillary, Supraclavicular, or Inguinal Adenopathy.   Labs: Lab Results  Component Value Date   WBC 7.7 11/25/2015   HGB 14.5 11/25/2015   HCT 42.9 11/25/2015   MCV 84.0 11/25/2015   PLT 237 11/25/2015    Labs performed by PCP  Assessment:   1. Well woman exam with routine gynecological exam   2. Obesity, Class III, BMI 40-49.9 (morbid obesity) (HCC) 243 lb   3. Primary hypertension   4. Diabetes mellitus without complication ([place])   5. Incomplete uterine prolapse   6. Sweaty feet      Plan:  Pap:  UTD Mammogram:  UTD Colon Screening:   UTD Labs:  UTD Routine preventative health maintenance measures emphasized: Exercise/Diet/Weight control, Tobacco Warnings, Alcohol/Substance use risks, Stress Management Incomplete uterine prolapse, not bothersome to patient.  HTN and DM managed by PCP Discussed use of cornstarch or talc powder when wearing shoes. No evidence of dermatitis.  Return to [place], MD [facility]

## 2021-12-03 ENCOUNTER — Other Ambulatory Visit: Payer: Self-pay

## 2021-12-24 ENCOUNTER — Other Ambulatory Visit: Payer: Self-pay

## 2021-12-26 ENCOUNTER — Other Ambulatory Visit: Payer: Self-pay

## 2021-12-27 ENCOUNTER — Other Ambulatory Visit: Payer: Self-pay

## 2021-12-27 MED ORDER — OMEPRAZOLE 20 MG PO CPDR
DELAYED_RELEASE_CAPSULE | ORAL | 1 refills | Status: DC
  Filled 2021-12-27: qty 180, 90d supply, fill #0
  Filled 2022-03-28: qty 180, 90d supply, fill #1

## 2021-12-27 MED ORDER — LOSARTAN POTASSIUM 25 MG PO TABS
ORAL_TABLET | ORAL | 1 refills | Status: DC
Start: 2021-12-27 — End: 2022-05-04
  Filled 2021-12-27: qty 90, 90d supply, fill #0
  Filled 2022-03-28: qty 90, 90d supply, fill #1

## 2021-12-28 ENCOUNTER — Other Ambulatory Visit: Payer: Self-pay

## 2021-12-30 ENCOUNTER — Other Ambulatory Visit: Payer: Self-pay

## 2022-01-04 DIAGNOSIS — E7800 Pure hypercholesterolemia, unspecified: Secondary | ICD-10-CM | POA: Diagnosis not present

## 2022-01-04 DIAGNOSIS — M170 Bilateral primary osteoarthritis of knee: Secondary | ICD-10-CM | POA: Diagnosis not present

## 2022-01-04 DIAGNOSIS — I10 Essential (primary) hypertension: Secondary | ICD-10-CM | POA: Diagnosis not present

## 2022-01-04 DIAGNOSIS — E1165 Type 2 diabetes mellitus with hyperglycemia: Secondary | ICD-10-CM | POA: Diagnosis not present

## 2022-01-04 DIAGNOSIS — Z6841 Body Mass Index (BMI) 40.0 and over, adult: Secondary | ICD-10-CM | POA: Diagnosis not present

## 2022-01-11 ENCOUNTER — Other Ambulatory Visit: Payer: Self-pay

## 2022-01-11 DIAGNOSIS — Z1331 Encounter for screening for depression: Secondary | ICD-10-CM | POA: Diagnosis not present

## 2022-01-11 DIAGNOSIS — E1165 Type 2 diabetes mellitus with hyperglycemia: Secondary | ICD-10-CM | POA: Diagnosis not present

## 2022-01-11 DIAGNOSIS — L299 Pruritus, unspecified: Secondary | ICD-10-CM | POA: Diagnosis not present

## 2022-01-11 DIAGNOSIS — I10 Essential (primary) hypertension: Secondary | ICD-10-CM | POA: Diagnosis not present

## 2022-01-11 DIAGNOSIS — Z0000 Encounter for general adult medical examination without abnormal findings: Secondary | ICD-10-CM | POA: Diagnosis not present

## 2022-01-11 DIAGNOSIS — J302 Other seasonal allergic rhinitis: Secondary | ICD-10-CM | POA: Diagnosis not present

## 2022-01-11 DIAGNOSIS — Z6841 Body Mass Index (BMI) 40.0 and over, adult: Secondary | ICD-10-CM | POA: Diagnosis not present

## 2022-01-11 MED ORDER — NYSTATIN 100000 UNIT/GM EX POWD
Freq: Two times a day (BID) | CUTANEOUS | 2 refills | Status: DC
  Filled 2022-01-11: qty 60, 15d supply, fill #0

## 2022-01-11 MED ORDER — OZEMPIC (0.25 OR 0.5 MG/DOSE) 2 MG/3ML ~~LOC~~ SOPN
PEN_INJECTOR | SUBCUTANEOUS | 5 refills | Status: DC
  Filled 2022-01-11 – 2022-02-04 (×2): qty 3, 28d supply, fill #0
  Filled 2022-02-28: qty 3, 28d supply, fill #1
  Filled 2022-03-28: qty 3, 28d supply, fill #2
  Filled 2022-04-21: qty 3, 28d supply, fill #3
  Filled 2022-05-23: qty 3, 28d supply, fill #4
  Filled 2022-06-20: qty 3, 28d supply, fill #5

## 2022-01-20 ENCOUNTER — Other Ambulatory Visit: Payer: Self-pay

## 2022-01-21 ENCOUNTER — Other Ambulatory Visit: Payer: Self-pay

## 2022-01-24 DIAGNOSIS — E119 Type 2 diabetes mellitus without complications: Secondary | ICD-10-CM | POA: Diagnosis not present

## 2022-02-04 ENCOUNTER — Other Ambulatory Visit: Payer: Self-pay

## 2022-02-28 ENCOUNTER — Other Ambulatory Visit: Payer: Self-pay

## 2022-03-02 ENCOUNTER — Other Ambulatory Visit: Payer: Self-pay

## 2022-03-02 MED ORDER — AZITHROMYCIN 250 MG PO TABS
ORAL_TABLET | ORAL | 0 refills | Status: AC
  Filled 2022-03-02: qty 6, 5d supply, fill #0

## 2022-03-08 ENCOUNTER — Other Ambulatory Visit: Payer: Self-pay

## 2022-03-08 MED ORDER — FLUCONAZOLE 150 MG PO TABS
150.0000 mg | ORAL_TABLET | Freq: Once | ORAL | 0 refills | Status: AC
  Filled 2022-03-08: qty 1, 1d supply, fill #0

## 2022-03-28 ENCOUNTER — Other Ambulatory Visit: Payer: Self-pay

## 2022-04-20 ENCOUNTER — Other Ambulatory Visit: Payer: Self-pay

## 2022-04-21 ENCOUNTER — Other Ambulatory Visit: Payer: Self-pay

## 2022-04-21 MED ORDER — FLUCONAZOLE 150 MG PO TABS
150.0000 mg | ORAL_TABLET | Freq: Once | ORAL | 0 refills | Status: AC
  Filled 2022-04-21: qty 1, 1d supply, fill #0

## 2022-04-21 MED ORDER — GLIMEPIRIDE 4 MG PO TABS
ORAL_TABLET | Freq: Two times a day (BID) | ORAL | 1 refills | Status: DC
  Filled 2022-04-21: qty 180, 90d supply, fill #0
  Filled 2023-01-30: qty 180, 90d supply, fill #1

## 2022-04-27 DIAGNOSIS — Z0000 Encounter for general adult medical examination without abnormal findings: Secondary | ICD-10-CM | POA: Diagnosis not present

## 2022-04-27 DIAGNOSIS — Z6841 Body Mass Index (BMI) 40.0 and over, adult: Secondary | ICD-10-CM | POA: Diagnosis not present

## 2022-04-27 DIAGNOSIS — I10 Essential (primary) hypertension: Secondary | ICD-10-CM | POA: Diagnosis not present

## 2022-04-27 DIAGNOSIS — L299 Pruritus, unspecified: Secondary | ICD-10-CM | POA: Diagnosis not present

## 2022-04-27 DIAGNOSIS — E1165 Type 2 diabetes mellitus with hyperglycemia: Secondary | ICD-10-CM | POA: Diagnosis not present

## 2022-04-27 DIAGNOSIS — J302 Other seasonal allergic rhinitis: Secondary | ICD-10-CM | POA: Diagnosis not present

## 2022-05-03 ENCOUNTER — Other Ambulatory Visit: Payer: Self-pay

## 2022-05-04 ENCOUNTER — Other Ambulatory Visit: Payer: Self-pay

## 2022-05-04 ENCOUNTER — Other Ambulatory Visit (HOSPITAL_COMMUNITY): Payer: Self-pay

## 2022-05-04 DIAGNOSIS — J209 Acute bronchitis, unspecified: Secondary | ICD-10-CM | POA: Diagnosis not present

## 2022-05-04 DIAGNOSIS — M25562 Pain in left knee: Secondary | ICD-10-CM | POA: Diagnosis not present

## 2022-05-04 DIAGNOSIS — E1165 Type 2 diabetes mellitus with hyperglycemia: Secondary | ICD-10-CM | POA: Diagnosis not present

## 2022-05-04 DIAGNOSIS — I10 Essential (primary) hypertension: Secondary | ICD-10-CM | POA: Diagnosis not present

## 2022-05-04 DIAGNOSIS — E7800 Pure hypercholesterolemia, unspecified: Secondary | ICD-10-CM | POA: Diagnosis not present

## 2022-05-04 DIAGNOSIS — Z6841 Body Mass Index (BMI) 40.0 and over, adult: Secondary | ICD-10-CM | POA: Diagnosis not present

## 2022-05-04 DIAGNOSIS — M1712 Unilateral primary osteoarthritis, left knee: Secondary | ICD-10-CM | POA: Diagnosis not present

## 2022-05-04 MED ORDER — MELOXICAM 15 MG PO TABS
15.0000 mg | ORAL_TABLET | Freq: Every day | ORAL | 3 refills | Status: DC | PRN
  Filled 2022-05-04: qty 30, 30d supply, fill #0

## 2022-05-04 MED ORDER — FLUCONAZOLE 150 MG PO TABS
150.0000 mg | ORAL_TABLET | Freq: Once | ORAL | 1 refills | Status: DC
  Filled 2022-05-04: qty 1, 1d supply, fill #0
  Filled 2023-01-24: qty 1, 1d supply, fill #1

## 2022-05-04 MED ORDER — LOSARTAN POTASSIUM 25 MG PO TABS
25.0000 mg | ORAL_TABLET | Freq: Every day | ORAL | 1 refills | Status: DC
  Filled 2022-05-04 – 2022-06-20 (×2): qty 90, 90d supply, fill #0
  Filled 2022-09-19: qty 90, 90d supply, fill #1

## 2022-05-04 MED ORDER — GLIMEPIRIDE 4 MG PO TABS
4.0000 mg | ORAL_TABLET | Freq: Two times a day (BID) | ORAL | 1 refills | Status: DC
  Filled 2022-05-04 – 2022-07-19 (×2): qty 180, 90d supply, fill #0
  Filled 2022-10-26: qty 180, 90d supply, fill #1

## 2022-05-04 MED ORDER — CEFUROXIME AXETIL 250 MG PO TABS
ORAL_TABLET | ORAL | 0 refills | Status: DC
  Filled 2022-05-04: qty 20, 10d supply, fill #0

## 2022-05-04 MED ORDER — ATORVASTATIN CALCIUM 10 MG PO TABS
10.0000 mg | ORAL_TABLET | Freq: Every day | ORAL | 1 refills | Status: DC
  Filled 2022-05-04: qty 90, 90d supply, fill #0
  Filled 2022-10-26: qty 90, 90d supply, fill #1

## 2022-05-04 MED ORDER — FARXIGA 10 MG PO TABS
10.0000 mg | ORAL_TABLET | Freq: Every day | ORAL | 1 refills | Status: DC
  Filled 2022-05-04: qty 90, 90d supply, fill #0
  Filled 2022-07-19: qty 30, 30d supply, fill #1
  Filled 2022-08-30: qty 30, 30d supply, fill #2
  Filled 2022-09-29: qty 30, 30d supply, fill #3

## 2022-05-23 ENCOUNTER — Other Ambulatory Visit: Payer: Self-pay

## 2022-06-16 ENCOUNTER — Other Ambulatory Visit: Payer: Self-pay

## 2022-06-16 MED ORDER — AZITHROMYCIN 250 MG PO TABS
ORAL_TABLET | ORAL | 0 refills | Status: DC
  Filled 2022-06-16: qty 6, 5d supply, fill #0

## 2022-06-20 ENCOUNTER — Other Ambulatory Visit: Payer: Self-pay

## 2022-06-21 ENCOUNTER — Other Ambulatory Visit: Payer: Self-pay

## 2022-07-14 ENCOUNTER — Other Ambulatory Visit: Payer: Self-pay

## 2022-07-14 MED ORDER — IBUPROFEN 800 MG PO TABS
800.0000 mg | ORAL_TABLET | Freq: Four times a day (QID) | ORAL | 0 refills | Status: DC | PRN
  Filled 2022-07-14: qty 30, 8d supply, fill #0

## 2022-07-14 MED ORDER — AMOXICILLIN 500 MG PO CAPS
500.0000 mg | ORAL_CAPSULE | Freq: Three times a day (TID) | ORAL | 0 refills | Status: DC
  Filled 2022-07-14: qty 30, 10d supply, fill #0

## 2022-07-19 ENCOUNTER — Other Ambulatory Visit: Payer: Self-pay

## 2022-07-19 MED ORDER — SEMAGLUTIDE(0.25 OR 0.5MG/DOS) 2 MG/3ML ~~LOC~~ SOPN
0.5000 mg | PEN_INJECTOR | SUBCUTANEOUS | 5 refills | Status: DC
  Filled 2022-07-19: qty 3, 28d supply, fill #0
  Filled 2022-08-15: qty 3, 28d supply, fill #1
  Filled 2022-09-19: qty 3, 28d supply, fill #2
  Filled 2022-10-11: qty 3, 28d supply, fill #3

## 2022-07-27 DIAGNOSIS — E7800 Pure hypercholesterolemia, unspecified: Secondary | ICD-10-CM | POA: Diagnosis not present

## 2022-07-27 DIAGNOSIS — Z6841 Body Mass Index (BMI) 40.0 and over, adult: Secondary | ICD-10-CM | POA: Diagnosis not present

## 2022-07-27 DIAGNOSIS — E1165 Type 2 diabetes mellitus with hyperglycemia: Secondary | ICD-10-CM | POA: Diagnosis not present

## 2022-07-27 DIAGNOSIS — I10 Essential (primary) hypertension: Secondary | ICD-10-CM | POA: Diagnosis not present

## 2022-07-27 DIAGNOSIS — J209 Acute bronchitis, unspecified: Secondary | ICD-10-CM | POA: Diagnosis not present

## 2022-07-27 DIAGNOSIS — M25562 Pain in left knee: Secondary | ICD-10-CM | POA: Diagnosis not present

## 2022-08-03 ENCOUNTER — Other Ambulatory Visit: Payer: Self-pay

## 2022-08-03 DIAGNOSIS — E1165 Type 2 diabetes mellitus with hyperglycemia: Secondary | ICD-10-CM | POA: Diagnosis not present

## 2022-08-03 DIAGNOSIS — Z6841 Body Mass Index (BMI) 40.0 and over, adult: Secondary | ICD-10-CM | POA: Diagnosis not present

## 2022-08-03 DIAGNOSIS — I10 Essential (primary) hypertension: Secondary | ICD-10-CM | POA: Diagnosis not present

## 2022-08-03 DIAGNOSIS — L299 Pruritus, unspecified: Secondary | ICD-10-CM | POA: Diagnosis not present

## 2022-08-03 DIAGNOSIS — Z23 Encounter for immunization: Secondary | ICD-10-CM | POA: Diagnosis not present

## 2022-08-03 DIAGNOSIS — J302 Other seasonal allergic rhinitis: Secondary | ICD-10-CM | POA: Diagnosis not present

## 2022-08-03 MED ORDER — NYSTATIN 100000 UNIT/GM EX POWD
1.0000 | Freq: Two times a day (BID) | CUTANEOUS | 2 refills | Status: DC
  Filled 2022-08-03: qty 60, 30d supply, fill #0

## 2022-08-05 ENCOUNTER — Other Ambulatory Visit: Payer: Self-pay

## 2022-08-08 ENCOUNTER — Other Ambulatory Visit: Payer: Self-pay

## 2022-08-08 MED ORDER — OMEPRAZOLE 20 MG PO CPDR
20.0000 mg | DELAYED_RELEASE_CAPSULE | Freq: Two times a day (BID) | ORAL | 1 refills | Status: DC
  Filled 2022-08-08: qty 180, 90d supply, fill #0
  Filled 2022-11-15: qty 180, 90d supply, fill #1

## 2022-08-15 ENCOUNTER — Other Ambulatory Visit: Payer: Self-pay

## 2022-08-30 ENCOUNTER — Other Ambulatory Visit: Payer: Self-pay

## 2022-09-19 ENCOUNTER — Other Ambulatory Visit: Payer: Self-pay

## 2022-09-29 ENCOUNTER — Other Ambulatory Visit: Payer: Self-pay

## 2022-09-29 DIAGNOSIS — E1165 Type 2 diabetes mellitus with hyperglycemia: Secondary | ICD-10-CM | POA: Diagnosis not present

## 2022-09-29 DIAGNOSIS — L03032 Cellulitis of left toe: Secondary | ICD-10-CM | POA: Diagnosis not present

## 2022-10-10 ENCOUNTER — Other Ambulatory Visit: Payer: Self-pay

## 2022-10-10 MED ORDER — CEPHALEXIN 500 MG PO CAPS
ORAL_CAPSULE | ORAL | 0 refills | Status: DC
  Filled 2022-10-10: qty 30, 10d supply, fill #0

## 2022-10-11 ENCOUNTER — Other Ambulatory Visit: Payer: Self-pay

## 2022-10-11 ENCOUNTER — Other Ambulatory Visit (HOSPITAL_COMMUNITY): Payer: Self-pay

## 2022-10-12 ENCOUNTER — Other Ambulatory Visit: Payer: Self-pay

## 2022-10-13 ENCOUNTER — Other Ambulatory Visit: Payer: Self-pay

## 2022-10-13 DIAGNOSIS — E1165 Type 2 diabetes mellitus with hyperglycemia: Secondary | ICD-10-CM | POA: Diagnosis not present

## 2022-10-13 DIAGNOSIS — L03032 Cellulitis of left toe: Secondary | ICD-10-CM | POA: Diagnosis not present

## 2022-10-13 MED ORDER — FLUCONAZOLE 150 MG PO TABS
150.0000 mg | ORAL_TABLET | Freq: Once | ORAL | 0 refills | Status: AC
  Filled 2022-10-13: qty 1, 1d supply, fill #0

## 2022-10-26 ENCOUNTER — Other Ambulatory Visit: Payer: Self-pay

## 2022-10-26 MED ORDER — DAPAGLIFLOZIN PROPANEDIOL 10 MG PO TABS
10.0000 mg | ORAL_TABLET | Freq: Every day | ORAL | 1 refills | Status: DC
  Filled 2022-10-26: qty 90, 90d supply, fill #0

## 2022-10-27 ENCOUNTER — Other Ambulatory Visit: Payer: Self-pay

## 2022-10-27 DIAGNOSIS — J302 Other seasonal allergic rhinitis: Secondary | ICD-10-CM | POA: Diagnosis not present

## 2022-10-27 DIAGNOSIS — R8290 Unspecified abnormal findings in urine: Secondary | ICD-10-CM | POA: Diagnosis not present

## 2022-10-27 DIAGNOSIS — Z6841 Body Mass Index (BMI) 40.0 and over, adult: Secondary | ICD-10-CM | POA: Diagnosis not present

## 2022-10-27 DIAGNOSIS — L299 Pruritus, unspecified: Secondary | ICD-10-CM | POA: Diagnosis not present

## 2022-10-27 DIAGNOSIS — E1165 Type 2 diabetes mellitus with hyperglycemia: Secondary | ICD-10-CM | POA: Diagnosis not present

## 2022-10-27 DIAGNOSIS — I10 Essential (primary) hypertension: Secondary | ICD-10-CM | POA: Diagnosis not present

## 2022-11-02 ENCOUNTER — Other Ambulatory Visit: Payer: Self-pay

## 2022-11-02 DIAGNOSIS — E1165 Type 2 diabetes mellitus with hyperglycemia: Secondary | ICD-10-CM | POA: Diagnosis not present

## 2022-11-02 DIAGNOSIS — Z6841 Body Mass Index (BMI) 40.0 and over, adult: Secondary | ICD-10-CM | POA: Diagnosis not present

## 2022-11-02 DIAGNOSIS — I10 Essential (primary) hypertension: Secondary | ICD-10-CM | POA: Diagnosis not present

## 2022-11-02 DIAGNOSIS — M1712 Unilateral primary osteoarthritis, left knee: Secondary | ICD-10-CM | POA: Diagnosis not present

## 2022-11-02 MED ORDER — OZEMPIC (1 MG/DOSE) 4 MG/3ML ~~LOC~~ SOPN
1.0000 mg | PEN_INJECTOR | SUBCUTANEOUS | 3 refills | Status: DC
  Filled 2022-11-02: qty 3, 28d supply, fill #0
  Filled 2022-12-12: qty 3, 28d supply, fill #1
  Filled 2023-01-10: qty 3, 28d supply, fill #2
  Filled 2023-02-06: qty 3, 28d supply, fill #3

## 2022-11-15 ENCOUNTER — Other Ambulatory Visit: Payer: Self-pay

## 2022-11-17 ENCOUNTER — Other Ambulatory Visit: Payer: Self-pay | Admitting: Internal Medicine

## 2022-11-17 DIAGNOSIS — Z1231 Encounter for screening mammogram for malignant neoplasm of breast: Secondary | ICD-10-CM

## 2022-11-29 DIAGNOSIS — M79674 Pain in right toe(s): Secondary | ICD-10-CM | POA: Diagnosis not present

## 2022-11-29 DIAGNOSIS — E1165 Type 2 diabetes mellitus with hyperglycemia: Secondary | ICD-10-CM | POA: Diagnosis not present

## 2022-11-29 DIAGNOSIS — B351 Tinea unguium: Secondary | ICD-10-CM | POA: Diagnosis not present

## 2022-11-29 DIAGNOSIS — L03032 Cellulitis of left toe: Secondary | ICD-10-CM | POA: Diagnosis not present

## 2022-11-29 DIAGNOSIS — M79675 Pain in left toe(s): Secondary | ICD-10-CM | POA: Diagnosis not present

## 2022-12-08 ENCOUNTER — Ambulatory Visit: Payer: Commercial Managed Care - PPO | Admitting: Obstetrics and Gynecology

## 2022-12-12 ENCOUNTER — Other Ambulatory Visit: Payer: Self-pay

## 2022-12-13 ENCOUNTER — Other Ambulatory Visit: Payer: Self-pay

## 2022-12-13 MED ORDER — LOSARTAN POTASSIUM 25 MG PO TABS
25.0000 mg | ORAL_TABLET | Freq: Every day | ORAL | 1 refills | Status: DC
  Filled 2022-12-13: qty 90, 90d supply, fill #0
  Filled 2023-12-08: qty 90, 90d supply, fill #1

## 2022-12-14 NOTE — Progress Notes (Unsigned)
ANNUAL PREVENTATIVE CARE GYNECOLOGY  ENCOUNTER NOTE  Subjective:       Patricia Sherman is a 62 y.o. [phone] female here for a routine annual gynecologic exam. The patient {is/is not/has never been:13135} sexually active. The patient {is/is not:13135} taking hormone replacement therapy. {post-men bleed:13152::"Patient denies post-menopausal vaginal bleeding."} The patient wears seatbelts: {yes/no:311178}. The patient participates in regular exercise: {yes/no/not asked:9010}. Has the patient ever been transfused or tattooed?: {yes/no/not asked:9010}. The patient reports that there {is/is not:9024} domestic violence in her life.  Current complaints: 1.  ***    Gynecologic History No LMP recorded. Patient is postmenopausal. Contraception: post menopausal status Last Pap:  11/10/20. Results were: normal  Last mammogram: 12/16/2022. Results were: normal Last Colonoscopy: 02/04/2021: 10 years Last Dexa Scan: Never done   Obstetric History OB History  Gravida Para Term Preterm AB Living  5 2 2   3 2   SAB IAB Ectopic Multiple Live Births    0     2    # Outcome Date GA Lbr Len/2nd Weight Sex Type Anes PTL Lv  5 Term 1992   7 lb 6.4 oz (3.357 kg) F Vag-Spont   LIV  4 Term 1985   6 lb 11.2 oz (3.039 kg) F Vag-Spont   LIV  3 AB           2 AB           1 AB             Past Medical History:  Diagnosis Date   Diabetes mellitus without complication (HCC)    Environmental allergies    Hyperlipidemia    Hypertension    Uterine leiomyoma 02/09/2016    Family History  Problem Relation Age of Onset   Heart disease Mother    Hypertension Mother    Diabetes Sister    Hypertension Sister    Heart disease Brother    Hypertension Brother    Diabetes Daughter    Breast cancer Neg Hx    Ovarian cancer Neg Hx    Colon cancer Neg Hx     Past Surgical History:  Procedure Laterality Date   CATARACT EXTRACTION     COLONOSCOPY WITH PROPOFOL N/A 02/04/2021   Procedure: COLONOSCOPY  WITH PROPOFOL;  Surgeon: Jaynie Collins, DO;  Location: [facility] ENDOSCOPY;  Service: Gastroenterology;  Laterality: N/A;   TONSILLECTOMY     TUBAL LIGATION      Social History   Socioeconomic History   Marital status: Single    Spouse name: Not on file   Number of children: Not on file   Years of education: Not on file   Highest education level: Not on file  Occupational History   Not on file  Tobacco Use   Smoking status: Never   Smokeless tobacco: Never  Vaping Use   Vaping status: Never Used  Substance and Sexual Activity   Alcohol use: No    Alcohol/week: 0.0 standard drinks of alcohol   Drug use: No   Sexual activity: Not Currently    Partners: Male    Birth control/protection: Post-menopausal  Other Topics Concern   Not on file  Social History Narrative   Not on file   Social Determinants of Health   Financial Resource Strain: Low Risk  (11/02/2022)   Received [facility], [facility]   Overall Financial Resource Strain (CARDIA)    Difficulty of Paying Living Expenses: Not hard at all  Food  Insecurity: No Food Insecurity (11/02/2022)   Received from [facility], [facility]   Hunger Vital Sign    Worried About Running Out of Food in the Last Year: Never true    Ran Out of Food in the Last Year: Never true  Transportation Needs: No Transportation Needs (11/02/2022)   Received from [facility], [facility]   [facility] - Transportation    In the past 12 months, has lack of transportation kept you from medical appointments or from getting medications?: No    Lack of Transportation (Non-Medical): No  Physical Activity: Inactive (11/07/2018)   Exercise Vital Sign    Days of Exercise per Week: 0 days    Minutes of Exercise per Session: 0 min  Stress: No Stress Concern Present (11/07/2018)   [employer] [facility] [employer] [facility] - Occupational Stress  Questionnaire    Feeling of Stress : Not at all  Social Connections: Not on file  Intimate Partner Violence: Not At Risk (11/07/2018)   Humiliation, Afraid, Rape, and Kick questionnaire    Fear of Current or Ex-Partner: No    Emotionally Abused: No    Physically Abused: No    Sexually Abused: No    Current Outpatient Medications on File Prior to Visit  Medication Sig Dispense Refill   amoxicillin (AMOXIL) 500 MG capsule Take 1 capsule (500 mg total) by mouth 3 (three) times daily for 10 days. 30 capsule 0   atorvastatin (LIPITOR) 10 MG tablet Take 10 mg daily by mouth.     atorvastatin (LIPITOR) 10 MG tablet Take 1 tablet (10 mg total) by mouth once daily 90 tablet 1   azithromycin (ZITHROMAX) 250 MG tablet Take 2 tablets (500mg ) by mouth on Day 1, then 1 tablet daily on Days 2-5. 6 tablet 0   Blood Glucose Monitoring Suppl (FREESTYLE FREEDOM LITE) w/Device KIT Use as directed to test twice daily 1 kit 0   cefUROXime (CEFTIN) 250 MG tablet Take 1 tablet (250 mg total) by mouth 2 (two) times daily for 10 days 20 tablet 0   cephALEXin (KEFLEX) 500 MG capsule Take 1 capsule (500 mg total) by mouth 3 (three) times daily for 10 days 30 capsule 0   dapagliflozin propanediol (FARXIGA) 10 MG TABS tablet Take 1 tablet (10 mg total) by mouth every morning 90 tablet 3   dapagliflozin propanediol (FARXIGA) 10 MG TABS tablet Take 1 tablet (10 mg total) by mouth once daily 90 tablet 1   fexofenadine (ALLERGY 24-HR) 180 MG tablet Take 1 tablet (180 mg total) by mouth once daily 90 tablet 3   glimepiride (AMARYL) 4 MG tablet TAKE 1 TABLET BY MOUTH TWICE DAILY 180 tablet 1   glimepiride (AMARYL) 4 MG tablet Take 1 tablet (4 mg total) by mouth 2 (two) times daily. 180 tablet 1   glucose blood (FREESTYLE LITE) test strip Use as directed to test blood sugar two times daily 200 each 0   glucose blood (FREESTYLE LITE) test strip Use three times daily as directed 300 each 1   ibuprofen (ADVIL) 800 MG tablet take  one tablet by mouth every 6 to 8 hours as needed for pain 30 tablet 0   ibuprofen (ADVIL) 800 MG tablet Take 1 tablet (800 mg total) by mouth every 6 to 8 hours as needed for pain. 30 tablet 0   Lancets (FREESTYLE) lancets Use 1 each 2 (two) times daily 200 each 3   losartan (COZAAR) 25 MG  tablet TAKE 1 TABLET (25 MG TOTAL) BY MOUTH ONCE DAILY 90 tablet 1   losartan (COZAAR) 25 MG tablet Take 1 tablet (25 mg total) by mouth daily. 90 tablet 1   Misc Natural Products (ELDERBERRY ZINC/VIT C/IMMUNE) LOZG      Multiple Vitamin (MULTIVITAMIN) tablet Take 1 tablet by mouth daily.     nystatin (MYCOSTATIN/NYSTOP) powder Apply topically 2 (two) times daily 60 g 2   nystatin (MYCOSTATIN/NYSTOP) powder Apply 1 Application topically 2 (two) times daily. 60 g 2   omeprazole (PRILOSEC) 20 MG capsule Take 1 capsule (20 mg total) by mouth daily. 90 capsule 3   omeprazole (PRILOSEC) 20 MG capsule Take 1 capsule (20 mg total) by mouth 2 (two) times daily before a meal. 180 capsule 1   ondansetron (ZOFRAN) 4 MG tablet Take 1 tablet (4 mg total) by mouth every 8 (eight) hours as needed for Nausea for up to 27 days 20 tablet 3   Semaglutide, 1 MG/DOSE, (OZEMPIC, 1 MG/DOSE,) 4 MG/3ML SOPN Inject 1 mg into the skin once a week. 3 mL 3   Semaglutide,0.25 or 0.5MG /DOS, (OZEMPIC, 0.25 OR 0.5 MG/DOSE,) 2 MG/3ML SOPN Inject 0.25 mg into the skin once a week. 3 mL 4   sitaGLIPtin (JANUVIA) 100 MG tablet TAKE 1 TABLET BY MOUTH ONCE DAILY (Patient not taking: Reported on 11/25/2021) 90 tablet 3   Zoster Vaccine Adjuvanted [facility]) injection Inject 0.5 mL into the muscle once for 1 dose (Patient not taking: Reported on 11/10/2020) 1 each 1   No current facility-administered medications on file prior to visit.    Allergies  Allergen Reactions   Metformin Diarrhea and Other (See Comments)    Other reaction(s): Other (See Comments) Elevated heart rate Elevated heart rate   Sulfa Antibiotics Rash      Review of  Systems ROS Review of Systems - General ROS: negative for - chills, fatigue, fever, hot flashes, night sweats, weight gain or weight loss Psychological ROS: negative for - anxiety, decreased libido, depression, mood swings, physical abuse or sexual abuse Ophthalmic ROS: negative for - blurry vision, eye pain or loss of vision ENT ROS: negative for - headaches, hearing change, visual changes or vocal changes Allergy and Immunology ROS: negative for - hives, itchy/watery eyes or seasonal allergies Hematological and Lymphatic ROS: negative for - bleeding problems, bruising, swollen lymph nodes or weight loss Endocrine ROS: negative for - galactorrhea, hair pattern changes, hot flashes, malaise/lethargy, mood swings, palpitations, polydipsia/polyuria, skin changes, temperature intolerance or unexpected weight changes Breast ROS: negative for - new or changing breast lumps or nipple discharge Respiratory ROS: negative for - cough or shortness of breath Cardiovascular ROS: negative for - chest pain, irregular heartbeat, palpitations or shortness of breath Gastrointestinal ROS: no abdominal pain, change in bowel habits, or black or bloody stools Genito-Urinary ROS: no dysuria, trouble voiding, or hematuria Musculoskeletal ROS: negative for - joint pain or joint stiffness Neurological ROS: negative for - bowel and bladder control changes Dermatological ROS: negative for rash and skin lesion changes   Objective:   There were no vitals taken for this visit. CONSTITUTIONAL: Well-developed, well-nourished female in no acute distress.  PSYCHIATRIC: Normal mood and affect. Normal behavior. Normal judgment and thought content. NEUROLGIC: Alert and oriented to person, place, and time. Normal muscle tone coordination. No cranial nerve deficit noted. HENT:  Normocephalic, atraumatic, External right and left ear normal. Oropharynx is clear and moist EYES: Conjunctivae and EOM are normal. Pupils are equal,  round, and reactive  to light. No scleral icterus.  NECK: Normal range of motion, supple, no masses.  Normal thyroid.  SKIN: Skin is warm and dry. No rash noted. Not diaphoretic. No erythema. No pallor. CARDIOVASCULAR: Normal heart rate noted, regular rhythm, no murmur. RESPIRATORY: Clear to auscultation bilaterally. Effort and breath sounds normal, no problems with respiration noted. BREASTS: Symmetric in size. No masses, skin changes, nipple drainage, or lymphadenopathy. ABDOMEN: Soft, normal bowel sounds, no distention noted.  No tenderness, rebound or guarding.  BLADDER: Normal PELVIC:  Bladder {:311640}  Urethra: {:311719}  Vulva: {:311722}  Vagina: {:311643}  Cervix: {:311644}  Uterus: {:311718}  Adnexa: {:311645}  RV: {Blank multiple:19196::"External Exam NormaI","No Rectal Masses","Normal Sphincter tone"}  MUSCULOSKELETAL: Normal range of motion. No tenderness.  No cyanosis, clubbing, or edema.  2+ distal pulses. LYMPHATIC: No Axillary, Supraclavicular, or Inguinal Adenopathy.   Labs: Lab Results  Component Value Date   WBC 7.7 11/25/2015   HGB 14.5 11/25/2015   HCT 42.9 11/25/2015   MCV 84.0 11/25/2015   PLT 237 11/25/2015    Lab Results  Component Value Date   CREATININE 0.76 11/25/2015   BUN 15 11/25/2015   NA 137 11/25/2015   K 4.2 11/25/2015   CL 105 11/25/2015   CO2 25 11/25/2015    Lab Results  Component Value Date   ALT 29 09/15/2011   AST 23 09/15/2011   ALKPHOS 73 09/15/2011   BILITOT 0.2 09/15/2011    No results found for: "CHOL", "HDL", "LDLCALC", "LDLDIRECT", "TRIG", "CHOLHDL"  No results found for: "TSH"  No results found for: "HGBA1C"   Assessment:   No diagnosis found.   Plan:  Pap:  UTD Mammogram: Ordered Colon Screening:   UTD Labs: {Blank multiple:19196::"Lipid 1","FBS","TSH","Hemoglobin A1C","Vit D Level""***"} Routine preventative health maintenance measures emphasized: {Blank multiple:19196::"Exercise/Diet/Weight  control","Tobacco Warnings","Alcohol/Substance use risks","Stress Management","Peer Pressure Issues","Safe Sex"} COVID Vaccination status: Return to Clinic - 1 Year   Hildred Laser, MD [facility] [place]

## 2022-12-15 ENCOUNTER — Ambulatory Visit (INDEPENDENT_AMBULATORY_CARE_PROVIDER_SITE_OTHER): Payer: Commercial Managed Care - PPO | Admitting: Obstetrics and Gynecology

## 2022-12-15 ENCOUNTER — Encounter: Payer: Self-pay | Admitting: Obstetrics and Gynecology

## 2022-12-15 VITALS — BP 146/78 | HR 86 | Resp 16 | Ht 63.0 in | Wt 223.2 lb

## 2022-12-15 DIAGNOSIS — I10 Essential (primary) hypertension: Secondary | ICD-10-CM

## 2022-12-15 DIAGNOSIS — N812 Incomplete uterovaginal prolapse: Secondary | ICD-10-CM

## 2022-12-15 DIAGNOSIS — E119 Type 2 diabetes mellitus without complications: Secondary | ICD-10-CM

## 2022-12-15 DIAGNOSIS — Z01419 Encounter for gynecological examination (general) (routine) without abnormal findings: Secondary | ICD-10-CM | POA: Diagnosis not present

## 2022-12-15 NOTE — Patient Instructions (Signed)
Preventive Care 40-64 Years Old, Female Preventive care refers to lifestyle choices and visits with your health care provider that can promote health and wellness. Preventive care visits are also called wellness exams. What can I expect for my preventive care visit? Counseling Your health care provider may ask you questions about your: Medical history, including: Past medical problems. Family medical history. Pregnancy history. Current health, including: Menstrual cycle. Method of birth control. Emotional well-being. Home life and relationship well-being. Sexual activity and sexual health. Lifestyle, including: Alcohol, nicotine or tobacco, and drug use. Access to firearms. Diet, exercise, and sleep habits. Work and work environment. Sunscreen use. Safety issues such as seatbelt and bike helmet use. Physical exam Your health care provider will check your: Height and weight. These may be used to calculate your BMI (body mass index). BMI is a measurement that tells if you are at a healthy weight. Waist circumference. This measures the distance around your waistline. This measurement also tells if you are at a healthy weight and may help predict your risk of certain diseases, such as type 2 diabetes and high blood pressure. Heart rate and blood pressure. Body temperature. Skin for abnormal spots. What immunizations do I need?  Vaccines are usually given at various ages, according to a schedule. Your health care provider will recommend vaccines for you based on your age, medical history, and lifestyle or other factors, such as travel or where you work. What tests do I need? Screening Your health care provider may recommend screening tests for certain conditions. This may include: Lipid and cholesterol levels. Diabetes screening. This is done by checking your blood sugar (glucose) after you have not eaten for a while (fasting). Pelvic exam and Pap test. Hepatitis B test. Hepatitis C  test. HIV (human immunodeficiency virus) test. STI (sexually transmitted infection) testing, if you are at risk. Lung cancer screening. Colorectal cancer screening. Mammogram. Talk with your health care provider about when you should start having regular mammograms. This may depend on whether you have a family history of breast cancer. BRCA-related cancer screening. This may be done if you have a family history of breast, ovarian, tubal, or peritoneal cancers. Bone density scan. This is done to screen for osteoporosis. Talk with your health care provider about your test results, treatment options, and if necessary, the need for more tests. Follow these instructions at home: Eating and drinking  Eat a diet that includes fresh fruits and vegetables, whole grains, lean protein, and low-fat dairy products. Take vitamin and mineral supplements as recommended by your health care provider. Do not drink alcohol if: Your health care provider tells you not to drink. You are pregnant, may be pregnant, or are planning to become pregnant. If you drink alcohol: Limit how much you have to 0-1 drink a day. Know how much alcohol is in your drink. In the [place] one drink equals one 12 oz bottle of beer (355 mL), one 5 oz glass of wine (148 mL), or one 1 oz glass of hard liquor (44 mL). Lifestyle Brush your teeth every morning and night with fluoride toothpaste. Floss one time each day. Exercise for at least 30 minutes 5 or more days each week. Do not use any products that contain nicotine or tobacco. These products include cigarettes, chewing tobacco, and vaping devices, such as e-cigarettes. If you need help quitting, ask your health care provider. Do not use drugs. If you are sexually active, practice safe sex. Use a condom or other form of protection to   prevent STIs. If you do not wish to become pregnant, use a form of birth control. If you plan to become pregnant, see your health care provider for a  prepregnancy visit. Take aspirin only as told by your health care provider. Make sure that you understand how much to take and what form to take. Work with your health care provider to find out whether it is safe and beneficial for you to take aspirin daily. Find healthy ways to manage stress, such as: Meditation, yoga, or listening to music. Journaling. Talking to a trusted person. Spending time with friends and family. Minimize exposure to UV radiation to reduce your risk of skin cancer. Safety Always wear your seat belt while driving or riding in a vehicle. Do not drive: If you have been drinking alcohol. Do not ride with someone who has been drinking. When you are tired or distracted. While texting. If you have been using any mind-altering substances or drugs. Wear a helmet and other protective equipment during sports activities. If you have firearms in your house, make sure you follow all gun safety procedures. Seek help if you have been physically or sexually abused. What's next? Visit your health care provider once a year for an annual wellness visit. Ask your health care provider how often you should have your eyes and teeth checked. Stay up to date on all vaccines. This information is not intended to replace advice given to you by your health care provider. Make sure you discuss any questions you have with your health care provider. Document Revised: 10/14/2020 Document Reviewed: 10/14/2020 Elsevier Patient Education  2024 Elsevier Inc. Breast Self-Awareness Breast self-awareness is knowing how your breasts look and feel. You need to: Check your breasts on a regular basis. Tell your doctor about any changes. Become familiar with the look and feel of your breasts. This can help you catch a breast problem while it is still small and can be treated. You should do breast self-exams even if you have breast implants. What you need: A mirror. A well-lit room. A pillow or other  soft object. How to do a breast self-exam Follow these steps to do a breast self-exam: Look for changes  Take off all the clothes above your waist. Stand in front of a mirror in a room with good lighting. Put your hands down at your sides. Compare your breasts in the mirror. Look for any difference between them, such as: A difference in shape. A difference in size. Wrinkles, dips, and bumps in one breast and not the other. Look at each breast for changes in the skin, such as: Redness. Scaly areas. Skin that has gotten thicker. Dimpling. Open sores (ulcers). Look for changes in your nipples, such as: Fluid coming out of a nipple. Fluid around a nipple. Bleeding. Dimpling. Redness. A nipple that looks pushed in (retracted), or that has changed position. Feel for changes Lie on your back. Feel each breast. To do this: Pick a breast to feel. Place a pillow under the shoulder closest to that breast. Put the arm closest to that breast behind your head. Feel the nipple area of that breast using the hand of your other arm. Feel the area with the pads of your three middle fingers by making small circles with your fingers. Use light, medium, and firm pressure. Continue the overlapping circles, moving downward over the breast. Keep making circles with your fingers. Stop when you feel your ribs. Start making circles with your fingers again, this time going   upward until you reach your collarbone. Then, make circles outward across your breast and into your armpit area. Squeeze your nipple. Check for discharge and lumps. Repeat these steps to check your other breast. Sit or stand in the tub or shower. With soapy water on your skin, feel each breast the same way you did when you were lying down. Write down what you find Writing down what you find can help you remember what to tell your doctor. Write down: What is normal for each breast. Any changes you find in each breast. These  include: The kind of changes you find. A tender or painful breast. Any lump you find. Write down its size and where it is. When you last had your monthly period (menstrual cycle). General tips If you are breastfeeding, the best time to check your breasts is after you feed your baby or after you use a breast pump. If you get monthly bleeding, the best time to check your breasts is 5-7 days after your monthly cycle ends. With time, you will become comfortable with the self-exam. You will also start to know if there are changes in your breasts. Contact a doctor if: You see a change in the shape or size of your breasts or nipples. You see a change in the skin of your breast or nipples, such as red or scaly skin. You have fluid coming from your nipples that is not normal. You find a new lump or thick area. You have breast pain. You have any concerns about your breast health. Summary Breast self-awareness includes looking for changes in your breasts and feeling for changes within your breasts. You should do breast self-awareness in front of a mirror in a well-lit room. If you get monthly periods (menstrual cycles), the best time to check your breasts is 5-7 days after your period ends. Tell your doctor about any changes you see in your breasts. Changes include changes in size, changes on the skin, painful or tender breasts, or fluid from your nipples that is not normal. This information is not intended to replace advice given to you by your health care provider. Make sure you discuss any questions you have with your health care provider. Document Revised: 09/23/2021 Document Reviewed: 02/18/2021 Elsevier Patient Education  2024 Elsevier Inc.  

## 2022-12-16 ENCOUNTER — Ambulatory Visit
Admission: RE | Admit: 2022-12-16 | Discharge: 2022-12-16 | Disposition: A | Discharge status: Home / Self Care | Payer: Commercial Managed Care - PPO | Source: Ambulatory Visit | Attending: Internal Medicine | Admitting: Internal Medicine

## 2022-12-16 DIAGNOSIS — Z1231 Encounter for screening mammogram for malignant neoplasm of breast: Secondary | ICD-10-CM | POA: Diagnosis not present

## 2022-12-20 ENCOUNTER — Other Ambulatory Visit: Payer: Self-pay

## 2022-12-26 ENCOUNTER — Other Ambulatory Visit (HOSPITAL_BASED_OUTPATIENT_CLINIC_OR_DEPARTMENT_OTHER): Payer: Self-pay

## 2022-12-26 ENCOUNTER — Other Ambulatory Visit: Payer: Self-pay

## 2022-12-26 MED ORDER — BENZONATATE 200 MG PO CAPS
200.0000 mg | ORAL_CAPSULE | Freq: Three times a day (TID) | ORAL | 0 refills | Status: DC
  Filled 2022-12-26: qty 21, 7d supply, fill #0

## 2022-12-29 ENCOUNTER — Other Ambulatory Visit: Payer: Self-pay

## 2022-12-29 DIAGNOSIS — R0982 Postnasal drip: Secondary | ICD-10-CM | POA: Diagnosis not present

## 2022-12-29 DIAGNOSIS — J302 Other seasonal allergic rhinitis: Secondary | ICD-10-CM | POA: Diagnosis not present

## 2022-12-29 DIAGNOSIS — Z6841 Body Mass Index (BMI) 40.0 and over, adult: Secondary | ICD-10-CM | POA: Diagnosis not present

## 2022-12-29 DIAGNOSIS — R053 Chronic cough: Secondary | ICD-10-CM | POA: Diagnosis not present

## 2022-12-29 DIAGNOSIS — I10 Essential (primary) hypertension: Secondary | ICD-10-CM | POA: Diagnosis not present

## 2022-12-29 DIAGNOSIS — M170 Bilateral primary osteoarthritis of knee: Secondary | ICD-10-CM | POA: Diagnosis not present

## 2022-12-29 DIAGNOSIS — E1165 Type 2 diabetes mellitus with hyperglycemia: Secondary | ICD-10-CM | POA: Diagnosis not present

## 2022-12-29 MED ORDER — HYDROCOD POLI-CHLORPHE POLI ER 10-8 MG/5ML PO SUER
5.0000 mL | Freq: Every evening | ORAL | 0 refills | Status: DC | PRN
  Filled 2022-12-29: qty 115, 23d supply, fill #0

## 2023-01-10 ENCOUNTER — Other Ambulatory Visit: Payer: Self-pay

## 2023-01-24 ENCOUNTER — Other Ambulatory Visit: Payer: Self-pay

## 2023-01-26 ENCOUNTER — Other Ambulatory Visit: Payer: Self-pay

## 2023-01-26 MED ORDER — DAPAGLIFLOZIN PROPANEDIOL 10 MG PO TABS
10.0000 mg | ORAL_TABLET | Freq: Every day | ORAL | 1 refills | Status: DC
  Filled 2023-01-26: qty 90, 90d supply, fill #0
  Filled 2023-04-24: qty 90, 90d supply, fill #1

## 2023-01-30 ENCOUNTER — Other Ambulatory Visit: Payer: Self-pay

## 2023-01-31 DIAGNOSIS — Z961 Presence of intraocular lens: Secondary | ICD-10-CM | POA: Diagnosis not present

## 2023-02-06 ENCOUNTER — Other Ambulatory Visit: Payer: Self-pay

## 2023-02-14 DIAGNOSIS — Z6841 Body Mass Index (BMI) 40.0 and over, adult: Secondary | ICD-10-CM | POA: Diagnosis not present

## 2023-02-14 DIAGNOSIS — I10 Essential (primary) hypertension: Secondary | ICD-10-CM | POA: Diagnosis not present

## 2023-02-14 DIAGNOSIS — M1712 Unilateral primary osteoarthritis, left knee: Secondary | ICD-10-CM | POA: Diagnosis not present

## 2023-02-14 DIAGNOSIS — E1165 Type 2 diabetes mellitus with hyperglycemia: Secondary | ICD-10-CM | POA: Diagnosis not present

## 2023-02-15 DIAGNOSIS — N390 Urinary tract infection, site not specified: Secondary | ICD-10-CM | POA: Diagnosis not present

## 2023-02-15 DIAGNOSIS — A499 Bacterial infection, unspecified: Secondary | ICD-10-CM | POA: Diagnosis not present

## 2023-02-21 ENCOUNTER — Other Ambulatory Visit: Payer: Self-pay

## 2023-02-21 DIAGNOSIS — Z1339 Encounter for screening examination for other mental health and behavioral disorders: Secondary | ICD-10-CM | POA: Diagnosis not present

## 2023-02-21 DIAGNOSIS — Z6841 Body Mass Index (BMI) 40.0 and over, adult: Secondary | ICD-10-CM | POA: Diagnosis not present

## 2023-02-21 DIAGNOSIS — Z0000 Encounter for general adult medical examination without abnormal findings: Secondary | ICD-10-CM | POA: Diagnosis not present

## 2023-02-21 DIAGNOSIS — E1165 Type 2 diabetes mellitus with hyperglycemia: Secondary | ICD-10-CM | POA: Diagnosis not present

## 2023-02-21 DIAGNOSIS — J302 Other seasonal allergic rhinitis: Secondary | ICD-10-CM | POA: Diagnosis not present

## 2023-02-21 DIAGNOSIS — Z1331 Encounter for screening for depression: Secondary | ICD-10-CM | POA: Diagnosis not present

## 2023-02-21 DIAGNOSIS — I10 Essential (primary) hypertension: Secondary | ICD-10-CM | POA: Diagnosis not present

## 2023-02-21 MED ORDER — DAPAGLIFLOZIN PROPANEDIOL 10 MG PO TABS
10.0000 mg | ORAL_TABLET | Freq: Every day | ORAL | 1 refills | Status: AC
  Filled 2023-02-21 – 2023-10-18 (×2): qty 90, 90d supply, fill #0

## 2023-02-21 MED ORDER — GLIMEPIRIDE 4 MG PO TABS
4.0000 mg | ORAL_TABLET | Freq: Two times a day (BID) | ORAL | 1 refills | Status: AC
  Filled 2023-02-21 – 2023-05-11 (×2): qty 180, 90d supply, fill #0
  Filled 2024-02-09: qty 180, 90d supply, fill #1

## 2023-02-21 MED ORDER — LOSARTAN POTASSIUM 25 MG PO TABS
25.0000 mg | ORAL_TABLET | Freq: Every day | ORAL | 1 refills | Status: DC
  Filled 2023-02-21 – 2023-03-06 (×3): qty 90, 90d supply, fill #0
  Filled 2023-08-14 – 2023-08-21 (×2): qty 90, 90d supply, fill #1

## 2023-02-21 MED ORDER — ATORVASTATIN CALCIUM 10 MG PO TABS
10.0000 mg | ORAL_TABLET | Freq: Every day | ORAL | 1 refills | Status: DC
  Filled 2023-02-21 (×2): qty 90, 90d supply, fill #0

## 2023-02-21 MED ORDER — OMEPRAZOLE 20 MG PO CPDR
20.0000 mg | DELAYED_RELEASE_CAPSULE | Freq: Two times a day (BID) | ORAL | 1 refills | Status: AC
  Filled 2023-02-21 (×2): qty 180, 90d supply, fill #0
  Filled 2023-07-21: qty 180, 90d supply, fill #1

## 2023-02-21 MED ORDER — CLOTRIMAZOLE-BETAMETHASONE 1-0.05 % EX CREA
TOPICAL_CREAM | Freq: Two times a day (BID) | CUTANEOUS | 1 refills | Status: DC
  Filled 2023-02-21 (×2): qty 45, 30d supply, fill #0

## 2023-03-06 ENCOUNTER — Other Ambulatory Visit: Payer: Self-pay

## 2023-03-07 ENCOUNTER — Other Ambulatory Visit: Payer: Self-pay

## 2023-03-07 MED ORDER — SEMAGLUTIDE (1 MG/DOSE) 4 MG/3ML ~~LOC~~ SOPN
1.0000 mg | PEN_INJECTOR | SUBCUTANEOUS | 3 refills | Status: DC
  Filled 2023-03-07: qty 3, 28d supply, fill #0
  Filled 2023-04-03: qty 9, 84d supply, fill #1

## 2023-03-08 ENCOUNTER — Other Ambulatory Visit: Payer: Self-pay

## 2023-04-03 ENCOUNTER — Other Ambulatory Visit: Payer: Self-pay

## 2023-04-05 ENCOUNTER — Other Ambulatory Visit: Payer: Self-pay

## 2023-04-24 ENCOUNTER — Other Ambulatory Visit: Payer: Self-pay

## 2023-05-11 ENCOUNTER — Other Ambulatory Visit: Payer: Self-pay

## 2023-05-19 DIAGNOSIS — I10 Essential (primary) hypertension: Secondary | ICD-10-CM | POA: Diagnosis not present

## 2023-05-19 DIAGNOSIS — E1165 Type 2 diabetes mellitus with hyperglycemia: Secondary | ICD-10-CM | POA: Diagnosis not present

## 2023-05-19 DIAGNOSIS — R8290 Unspecified abnormal findings in urine: Secondary | ICD-10-CM | POA: Diagnosis not present

## 2023-05-19 DIAGNOSIS — Z0000 Encounter for general adult medical examination without abnormal findings: Secondary | ICD-10-CM | POA: Diagnosis not present

## 2023-05-19 DIAGNOSIS — J302 Other seasonal allergic rhinitis: Secondary | ICD-10-CM | POA: Diagnosis not present

## 2023-05-25 ENCOUNTER — Other Ambulatory Visit: Payer: Self-pay

## 2023-05-25 DIAGNOSIS — I10 Essential (primary) hypertension: Secondary | ICD-10-CM | POA: Diagnosis not present

## 2023-05-25 DIAGNOSIS — Z6841 Body Mass Index (BMI) 40.0 and over, adult: Secondary | ICD-10-CM | POA: Diagnosis not present

## 2023-05-25 DIAGNOSIS — E1165 Type 2 diabetes mellitus with hyperglycemia: Secondary | ICD-10-CM | POA: Diagnosis not present

## 2023-05-25 DIAGNOSIS — J302 Other seasonal allergic rhinitis: Secondary | ICD-10-CM | POA: Diagnosis not present

## 2023-05-25 MED ORDER — LOSARTAN POTASSIUM 25 MG PO TABS
25.0000 mg | ORAL_TABLET | Freq: Every day | ORAL | 1 refills | Status: DC
  Filled 2023-05-25: qty 90, 90d supply, fill #0

## 2023-05-25 MED ORDER — GLIMEPIRIDE 4 MG PO TABS
4.0000 mg | ORAL_TABLET | Freq: Two times a day (BID) | ORAL | 1 refills | Status: DC
  Filled 2023-05-25 – 2023-08-14 (×3): qty 180, 90d supply, fill #0
  Filled 2023-11-21: qty 180, 90d supply, fill #1

## 2023-05-25 MED ORDER — DAPAGLIFLOZIN PROPANEDIOL 10 MG PO TABS
10.0000 mg | ORAL_TABLET | Freq: Every day | ORAL | 1 refills | Status: DC
  Filled 2023-05-25 – 2023-07-21 (×3): qty 90, 90d supply, fill #0

## 2023-05-25 MED ORDER — ATORVASTATIN CALCIUM 10 MG PO TABS
10.0000 mg | ORAL_TABLET | Freq: Every day | ORAL | 1 refills | Status: DC
  Filled 2023-05-25: qty 74, 74d supply, fill #0
  Filled 2023-05-25: qty 16, 16d supply, fill #0

## 2023-06-06 ENCOUNTER — Other Ambulatory Visit: Payer: Self-pay

## 2023-06-07 ENCOUNTER — Other Ambulatory Visit: Payer: Self-pay

## 2023-06-07 MED ORDER — SEMAGLUTIDE (1 MG/DOSE) 4 MG/3ML ~~LOC~~ SOPN
1.0000 mg | PEN_INJECTOR | SUBCUTANEOUS | 3 refills | Status: DC
  Filled 2023-06-07: qty 3, 28d supply, fill #0
  Filled 2023-06-14: qty 9, 84d supply, fill #0
  Filled 2023-09-11: qty 3, 28d supply, fill #1

## 2023-06-14 ENCOUNTER — Other Ambulatory Visit: Payer: Self-pay

## 2023-06-22 ENCOUNTER — Other Ambulatory Visit: Payer: Self-pay

## 2023-06-28 ENCOUNTER — Other Ambulatory Visit (HOSPITAL_COMMUNITY): Payer: Self-pay

## 2023-07-12 ENCOUNTER — Other Ambulatory Visit: Payer: Self-pay

## 2023-07-13 ENCOUNTER — Other Ambulatory Visit: Payer: Self-pay

## 2023-07-13 MED ORDER — FLUCONAZOLE 150 MG PO TABS
150.0000 mg | ORAL_TABLET | Freq: Once | ORAL | 2 refills | Status: AC
  Filled 2023-07-13: qty 1, 1d supply, fill #0
  Filled 2023-09-01: qty 1, 1d supply, fill #1
  Filled 2023-10-06: qty 1, 1d supply, fill #2

## 2023-07-14 ENCOUNTER — Other Ambulatory Visit (HOSPITAL_BASED_OUTPATIENT_CLINIC_OR_DEPARTMENT_OTHER): Payer: Self-pay

## 2023-07-20 ENCOUNTER — Other Ambulatory Visit (HOSPITAL_COMMUNITY): Payer: Self-pay

## 2023-07-21 ENCOUNTER — Other Ambulatory Visit: Payer: Self-pay

## 2023-08-14 ENCOUNTER — Other Ambulatory Visit: Payer: Self-pay

## 2023-08-21 DIAGNOSIS — I10 Essential (primary) hypertension: Secondary | ICD-10-CM | POA: Diagnosis not present

## 2023-08-21 DIAGNOSIS — E1165 Type 2 diabetes mellitus with hyperglycemia: Secondary | ICD-10-CM | POA: Diagnosis not present

## 2023-08-21 DIAGNOSIS — Z6841 Body Mass Index (BMI) 40.0 and over, adult: Secondary | ICD-10-CM | POA: Diagnosis not present

## 2023-08-21 DIAGNOSIS — R8290 Unspecified abnormal findings in urine: Secondary | ICD-10-CM | POA: Diagnosis not present

## 2023-08-21 DIAGNOSIS — J302 Other seasonal allergic rhinitis: Secondary | ICD-10-CM | POA: Diagnosis not present

## 2023-08-25 ENCOUNTER — Other Ambulatory Visit: Payer: Self-pay

## 2023-08-25 DIAGNOSIS — I10 Essential (primary) hypertension: Secondary | ICD-10-CM | POA: Diagnosis not present

## 2023-08-25 DIAGNOSIS — E7800 Pure hypercholesterolemia, unspecified: Secondary | ICD-10-CM | POA: Diagnosis not present

## 2023-08-25 DIAGNOSIS — Z6841 Body Mass Index (BMI) 40.0 and over, adult: Secondary | ICD-10-CM | POA: Diagnosis not present

## 2023-08-25 DIAGNOSIS — M1712 Unilateral primary osteoarthritis, left knee: Secondary | ICD-10-CM | POA: Diagnosis not present

## 2023-08-25 DIAGNOSIS — E1165 Type 2 diabetes mellitus with hyperglycemia: Secondary | ICD-10-CM | POA: Diagnosis not present

## 2023-08-25 DIAGNOSIS — R3129 Other microscopic hematuria: Secondary | ICD-10-CM | POA: Diagnosis not present

## 2023-08-25 DIAGNOSIS — Z9109 Other allergy status, other than to drugs and biological substances: Secondary | ICD-10-CM | POA: Diagnosis not present

## 2023-08-25 MED ORDER — ATORVASTATIN CALCIUM 10 MG PO TABS
10.0000 mg | ORAL_TABLET | ORAL | 1 refills | Status: AC
  Filled 2023-08-25: qty 39, 90d supply, fill #0
  Filled 2023-11-28 – 2024-02-09 (×2): qty 39, 90d supply, fill #1

## 2023-08-25 MED ORDER — OMEPRAZOLE 40 MG PO CPDR
40.0000 mg | DELAYED_RELEASE_CAPSULE | Freq: Two times a day (BID) | ORAL | 1 refills | Status: DC
  Filled 2023-08-25: qty 180, 90d supply, fill #0
  Filled 2023-11-28 – 2024-02-09 (×2): qty 180, 90d supply, fill #1

## 2023-09-01 ENCOUNTER — Other Ambulatory Visit: Payer: Self-pay

## 2023-09-01 ENCOUNTER — Ambulatory Visit: Admitting: Urology

## 2023-09-01 ENCOUNTER — Encounter: Payer: Self-pay | Admitting: Urology

## 2023-09-01 VITALS — BP 160/95 | HR 108 | Ht 63.0 in | Wt 223.0 lb

## 2023-09-01 DIAGNOSIS — R3129 Other microscopic hematuria: Secondary | ICD-10-CM | POA: Diagnosis not present

## 2023-09-01 NOTE — Patient Instructions (Signed)
Scheduling [phone] 

## 2023-09-01 NOTE — Progress Notes (Signed)
 I, [name], acting as a scribe for [name], MD., have documented all relevant documentation on the behalf of [name], MD, as directed by [name], MD while in the presence of [name], MD.  09/01/2023 3:13 PM   Patricia Sherman August 23, 1960 [id number]  Referring provider: Antonio Baumgarten, MD [address] [facility] [place],  [place] [address]  Chief Complaint  Patient presents with   Hematuria    HPI: Patricia Sherman is a 63 y.o. female referred for evaluation of microhematuria.  Urinalysis 08/21/23 showed the 6 RBCs per high-power field on microscopy in 2025. She has had several urinalysis that have also shown pyuria along with microhematuria with negative urine cultures.  She has no bothersome lower urinary tract symptoms and specifically denies frequency, urgency, dysuria, or urinary incontinence.  Denies gross hematuria.  Denies flank, abdominal, or pelvic pain.  She is a non-smoker and denies previous smoking history.    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     Hyperlipidemia    Hypertension    Uterine leiomyoma 02/09/2016    Surgical History: Past Surgical History:  Procedure Laterality Date   CATARACT EXTRACTION     COLONOSCOPY WITH PROPOFOL  N/A 02/04/2021   Procedure: COLONOSCOPY WITH PROPOFOL ;  Surgeon: Quintin Buckle, DO;  Location: [facility] ENDOSCOPY;  Service: Gastroenterology;  Laterality: N/A;   TONSILLECTOMY     TUBAL LIGATION      Home Medications:  Allergies as of 09/01/2023       Reactions   Metformin Diarrhea, Other (See Comments)   Other reaction(s): Other (See Comments) Elevated heart rate Elevated heart rate   Sulfa Antibiotics Rash        Medication List        Accurate as of Sep 01, 2023  3:13 PM. If you have any  questions, ask your nurse or doctor.          STOP taking these medications    benzonatate  200 MG capsule Commonly known as: TESSALON  Stopped by: [name]   chlorpheniramine-HYDROcodone 10-8 MG/5ML Commonly known as: TUSSIONEX Stopped by: Emaleigh Guimond C Zeppelin Commisso   clotrimazole -betamethasone  cream Commonly known as: LOTRISONE  Stopped by: [name]       TAKE these medications    Allergy Relief 180 MG tablet Generic drug: fexofenadine  Take 1 tablet (180 mg total) by mouth once daily   atorvastatin  10 MG tablet Commonly known as: LIPITOR Take 10 mg by mouth daily.   atorvastatin  10 MG tablet Commonly known as: LIPITOR Take 1 (one) tablet by mouth once daily.   atorvastatin  10 MG tablet Commonly known as: LIPITOR Take 1 tablet (10 mg total) by mouth once daily   atorvastatin  10 MG tablet Commonly known as: LIPITOR Take 1 tablet (10 mg total) by mouth 3 (three) times a week.   Elderberry Zinc/Vit C/Immune Lozg   Farxiga  10 MG Tabs tablet Generic drug: dapagliflozin  propanediol Take 1 tablet (10 mg total) by mouth every morning   Farxiga  10 MG Tabs tablet Generic drug: dapagliflozin  propanediol Take 1 tablet (10 mg total) by mouth once daily   dapagliflozin  propanediol 10 MG Tabs tablet Commonly known as: FARXIGA  Take 1 (one) tablet by mouth once daily.   Farxiga  10 MG Tabs tablet Generic drug: dapagliflozin  propanediol  Take 1 tablet (10 mg total) by mouth once daily   fluconazole  150 MG tablet Commonly known as: DIFLUCAN  Take 1 tablet (150 mg total) by mouth once for 1 dose   FreeStyle Freedom Lite w/Device Kit Use as directed to test twice daily   freestyle lancets Use two times daily as directed (Use 1 each 2 (two) times daily)   FREESTYLE LITE test strip Generic drug: glucose blood Use as directed to test blood sugar two times daily   glimepiride  4 MG tablet Commonly known as: AMARYL  Take 1 tablet (4 mg total) by mouth 2 (two) times  daily.   glimepiride  4 MG tablet Commonly known as: AMARYL  Take 1 tablet (4 mg total) by mouth 2 (two) times daily.   ibuprofen  800 MG tablet Commonly known as: ADVIL  take one tablet by mouth every 6 to 8 hours as needed for pain   losartan  25 MG tablet Commonly known as: COZAAR  Take 1 tablet (25 mg total) by mouth daily. What changed: Another medication with the same name was removed. Continue taking this medication, and follow the directions you see here. Changed by: [name]   multivitamin tablet Take 1 tablet by mouth daily.   omeprazole  20 MG capsule Commonly known as: PRILOSEC Take 1 capsule (20 mg total) by mouth 2 (two) times daily before a meal.   omeprazole  20 MG capsule Commonly known as: PRILOSEC Take 1 capsule (20 mg total) by mouth 2 (two) times daily before meals.   omeprazole  40 MG capsule Commonly known as: PRILOSEC Take 1 capsule (40 mg total) by mouth 2 (two) times daily before a meal.   ondansetron  4 MG tablet Commonly known as: ZOFRAN  Take one tablet by mouth every 8 hours as needed for nausea (Take 1 tablet (4 mg total) by mouth every 8 (eight) hours as needed for Nausea for up to 27 days)   Ozempic  (1 MG/DOSE) 4 MG/3ML Sopn Generic drug: Semaglutide  (1 MG/DOSE) Inject 1 mg into the skin once a week.        Allergies:  Allergies  Allergen Reactions   Metformin Diarrhea and Other (See Comments)    Other reaction(s): Other (See Comments) Elevated heart rate Elevated heart rate   Sulfa Antibiotics Rash    Family History: Family History  Problem Relation Age of Onset   Heart disease Mother    Hypertension Mother    Diabetes Sister    Hypertension Sister    Heart disease Brother    Hypertension Brother    Diabetes Daughter    Breast cancer Neg Hx    Ovarian cancer Neg Hx    Colon cancer Neg Hx     Social History:  reports that she has never smoked. She has never used smokeless tobacco. She reports that she does not drink  alcohol  and does not use drugs.   Physical Exam: BP (!) 160/95   Pulse (!) 108   Ht 5\' 3"  (1.6 m)   Wt 223 lb (101.2 kg)   BMI 39.50 kg/m   Constitutional:  Alert and oriented, No acute distress. HEENT: [place] AT. Respiratory: Normal respiratory effort, no increased work of breathing. Psychiatric: Normal mood and affect.   Urinalysis She was unable to void today.    Assessment & Plan:    1.  Microhematuria AUA risk stratification: High We discussed the recommended evaluation of high risk hematuria which consist of CT urogram and cystoscopy.  The procedures were discussed in detail and she has elected to  proceed with further evaluation All questions were answered CTU order placed and cystoscopy was scheduled  I have reviewed the above documentation for accuracy and completeness, and I agree with the above.   [name], MD  [facility] [address], [address] [place], [place] [address] [phone]

## 2023-09-06 ENCOUNTER — Ambulatory Visit
Admission: RE | Admit: 2023-09-06 | Discharge: 2023-09-06 | Disposition: A | Discharge status: Home / Self Care | Source: Ambulatory Visit | Attending: Urology | Admitting: Urology

## 2023-09-06 DIAGNOSIS — N281 Cyst of kidney, acquired: Secondary | ICD-10-CM | POA: Diagnosis not present

## 2023-09-06 DIAGNOSIS — K449 Diaphragmatic hernia without obstruction or gangrene: Secondary | ICD-10-CM | POA: Diagnosis not present

## 2023-09-06 DIAGNOSIS — K5730 Diverticulosis of large intestine without perforation or abscess without bleeding: Secondary | ICD-10-CM | POA: Diagnosis not present

## 2023-09-06 DIAGNOSIS — R3129 Other microscopic hematuria: Secondary | ICD-10-CM | POA: Insufficient documentation

## 2023-09-06 MED ORDER — IOHEXOL 300 MG/ML  SOLN
100.0000 mL | Freq: Once | INTRAMUSCULAR | Status: AC | PRN
  Administered 2023-09-06: 100 mL via INTRAVENOUS

## 2023-09-11 ENCOUNTER — Other Ambulatory Visit: Payer: Self-pay

## 2023-09-12 ENCOUNTER — Other Ambulatory Visit: Payer: Self-pay

## 2023-09-15 ENCOUNTER — Other Ambulatory Visit: Payer: Self-pay

## 2023-09-20 DIAGNOSIS — R399 Unspecified symptoms and signs involving the genitourinary system: Secondary | ICD-10-CM | POA: Diagnosis not present

## 2023-10-05 ENCOUNTER — Other Ambulatory Visit: Admitting: Urology

## 2023-10-06 ENCOUNTER — Other Ambulatory Visit: Payer: Self-pay

## 2023-10-06 MED ORDER — SEMAGLUTIDE (1 MG/DOSE) 4 MG/3ML ~~LOC~~ SOPN
1.0000 mg | PEN_INJECTOR | SUBCUTANEOUS | 3 refills | Status: DC
  Filled 2023-10-06: qty 3, 28d supply, fill #0
  Filled 2023-11-10: qty 3, 28d supply, fill #1
  Filled 2023-12-08: qty 3, 28d supply, fill #2
  Filled 2024-01-04: qty 3, 28d supply, fill #3

## 2023-10-18 ENCOUNTER — Other Ambulatory Visit: Payer: Self-pay

## 2023-10-20 ENCOUNTER — Other Ambulatory Visit: Payer: Self-pay

## 2023-11-01 ENCOUNTER — Ambulatory Visit (INDEPENDENT_AMBULATORY_CARE_PROVIDER_SITE_OTHER): Admitting: Urology

## 2023-11-01 ENCOUNTER — Encounter: Payer: Self-pay | Admitting: Urology

## 2023-11-01 VITALS — BP 135/96 | HR 80 | Ht 63.0 in | Wt 224.1 lb

## 2023-11-01 DIAGNOSIS — R3129 Other microscopic hematuria: Secondary | ICD-10-CM

## 2023-11-01 NOTE — Progress Notes (Signed)
   11/01/23  CC:  Chief Complaint  Patient presents with   Cysto    HPI: High risk microhematuria; see office note 09/01/2023.  CTU 09/06/2023 with bilateral parapelvic cysts.    Blood pressure (!) 135/96, pulse 80, height 5' 3 (1.6 m), weight 224 lb 2 oz (101.7 kg). Normal external genitalia with patent urethral meatus; + Cystocele  Cystoscopy Procedure Note  Patient identification was confirmed, informed consent was obtained, and patient was prepped using Betadine solution.  Lidocaine jelly was administered per urethral meatus.    Procedure: - Flexible cystoscope introduced, without any difficulty.   - Thorough search of the bladder revealed:    normal urethral meatus    normal urothelium    no stones    no ulcers     no tumors    no urethral polyps    no trabeculation  - Ureteral orifices were normal in position and appearance.  Post-Procedure: - Patient tolerated the procedure well  Assessment/ Plan: No upper tract abnormalities on CT urogram No bladder mucosal abnormalities on cystoscopy 28-month follow-up with [facility]   Glendia JAYSON Barba, MD

## 2023-11-10 ENCOUNTER — Other Ambulatory Visit: Payer: Self-pay

## 2023-11-17 DIAGNOSIS — R8290 Unspecified abnormal findings in urine: Secondary | ICD-10-CM | POA: Diagnosis not present

## 2023-11-17 DIAGNOSIS — Z6841 Body Mass Index (BMI) 40.0 and over, adult: Secondary | ICD-10-CM | POA: Diagnosis not present

## 2023-11-17 DIAGNOSIS — I10 Essential (primary) hypertension: Secondary | ICD-10-CM | POA: Diagnosis not present

## 2023-11-17 DIAGNOSIS — E7800 Pure hypercholesterolemia, unspecified: Secondary | ICD-10-CM | POA: Diagnosis not present

## 2023-11-17 DIAGNOSIS — M1712 Unilateral primary osteoarthritis, left knee: Secondary | ICD-10-CM | POA: Diagnosis not present

## 2023-11-17 DIAGNOSIS — E1165 Type 2 diabetes mellitus with hyperglycemia: Secondary | ICD-10-CM | POA: Diagnosis not present

## 2023-11-17 DIAGNOSIS — Z9109 Other allergy status, other than to drugs and biological substances: Secondary | ICD-10-CM | POA: Diagnosis not present

## 2023-11-21 ENCOUNTER — Other Ambulatory Visit: Payer: Self-pay

## 2023-11-21 ENCOUNTER — Other Ambulatory Visit: Payer: Self-pay | Admitting: Internal Medicine

## 2023-11-21 DIAGNOSIS — Z1231 Encounter for screening mammogram for malignant neoplasm of breast: Secondary | ICD-10-CM

## 2023-11-21 MED ORDER — CEFUROXIME AXETIL 250 MG PO TABS
250.0000 mg | ORAL_TABLET | Freq: Two times a day (BID) | ORAL | 0 refills | Status: AC
  Filled 2023-11-21: qty 14, 7d supply, fill #0

## 2023-11-24 DIAGNOSIS — Z6841 Body Mass Index (BMI) 40.0 and over, adult: Secondary | ICD-10-CM | POA: Diagnosis not present

## 2023-11-24 DIAGNOSIS — I10 Essential (primary) hypertension: Secondary | ICD-10-CM | POA: Diagnosis not present

## 2023-11-24 DIAGNOSIS — E1165 Type 2 diabetes mellitus with hyperglycemia: Secondary | ICD-10-CM | POA: Diagnosis not present

## 2023-11-24 DIAGNOSIS — N390 Urinary tract infection, site not specified: Secondary | ICD-10-CM | POA: Diagnosis not present

## 2023-11-24 DIAGNOSIS — J302 Other seasonal allergic rhinitis: Secondary | ICD-10-CM | POA: Diagnosis not present

## 2023-11-24 DIAGNOSIS — B9620 Unspecified Escherichia coli [E. coli] as the cause of diseases classified elsewhere: Secondary | ICD-10-CM | POA: Diagnosis not present

## 2023-11-27 ENCOUNTER — Other Ambulatory Visit: Payer: Self-pay

## 2023-11-28 ENCOUNTER — Other Ambulatory Visit (HOSPITAL_COMMUNITY): Payer: Self-pay

## 2023-11-29 ENCOUNTER — Other Ambulatory Visit: Payer: Self-pay

## 2023-11-29 ENCOUNTER — Other Ambulatory Visit (HOSPITAL_COMMUNITY): Payer: Self-pay

## 2023-11-29 MED ORDER — FLUCONAZOLE 150 MG PO TABS
150.0000 mg | ORAL_TABLET | Freq: Once | ORAL | 1 refills | Status: AC
  Filled 2023-11-29: qty 2, 2d supply, fill #0
  Filled 2024-02-13: qty 2, 2d supply, fill #1

## 2023-12-08 ENCOUNTER — Other Ambulatory Visit: Payer: Self-pay

## 2023-12-19 ENCOUNTER — Ambulatory Visit
Admission: RE | Admit: 2023-12-19 | Discharge: 2023-12-19 | Disposition: A | Discharge status: Home / Self Care | Source: Ambulatory Visit | Attending: Internal Medicine | Admitting: Internal Medicine

## 2023-12-19 DIAGNOSIS — Z1231 Encounter for screening mammogram for malignant neoplasm of breast: Secondary | ICD-10-CM | POA: Insufficient documentation

## 2024-01-03 ENCOUNTER — Emergency Department

## 2024-01-03 ENCOUNTER — Other Ambulatory Visit: Payer: Self-pay

## 2024-01-03 ENCOUNTER — Emergency Department
Admission: EM | Admit: 2024-01-03 | Discharge: 2024-01-04 | Disposition: A | Discharge status: Home / Self Care | Attending: Emergency Medicine | Admitting: Emergency Medicine

## 2024-01-03 DIAGNOSIS — E119 Type 2 diabetes mellitus without complications: Secondary | ICD-10-CM | POA: Insufficient documentation

## 2024-01-03 DIAGNOSIS — Z7984 Long term (current) use of oral hypoglycemic drugs: Secondary | ICD-10-CM | POA: Insufficient documentation

## 2024-01-03 DIAGNOSIS — E113292 Type 2 diabetes mellitus with mild nonproliferative diabetic retinopathy without macular edema, left eye: Secondary | ICD-10-CM | POA: Diagnosis not present

## 2024-01-03 DIAGNOSIS — Z79899 Other long term (current) drug therapy: Secondary | ICD-10-CM | POA: Diagnosis not present

## 2024-01-03 DIAGNOSIS — I10 Essential (primary) hypertension: Secondary | ICD-10-CM | POA: Diagnosis not present

## 2024-01-03 DIAGNOSIS — H43812 Vitreous degeneration, left eye: Secondary | ICD-10-CM | POA: Diagnosis not present

## 2024-01-03 DIAGNOSIS — H4920 Sixth [abducent] nerve palsy, unspecified eye: Secondary | ICD-10-CM | POA: Diagnosis not present

## 2024-01-03 DIAGNOSIS — N390 Urinary tract infection, site not specified: Secondary | ICD-10-CM | POA: Insufficient documentation

## 2024-01-03 DIAGNOSIS — H538 Other visual disturbances: Secondary | ICD-10-CM | POA: Diagnosis not present

## 2024-01-03 DIAGNOSIS — H40053 Ocular hypertension, bilateral: Secondary | ICD-10-CM | POA: Diagnosis not present

## 2024-01-03 LAB — BASIC METABOLIC PANEL WITH GFR
Anion gap: 12 (ref 5–15)
BUN: 14 mg/dL (ref 8–23)
CO2: 18 mmol/L — ABNORMAL LOW (ref 22–32)
Calcium: 9.3 mg/dL (ref 8.9–10.3)
Chloride: 107 mmol/L (ref 98–111)
Creatinine, Ser: 0.85 mg/dL (ref 0.44–1.00)
GFR, Estimated: >60 mL/min (ref >60)
Glucose, Bld: 173 mg/dL — ABNORMAL HIGH (ref 70–99)
Potassium: 3.8 mmol/L (ref 3.5–5.1)
Sodium: 137 mmol/L (ref 135–145)

## 2024-01-03 LAB — CBC WITH DIFFERENTIAL/PLATELET
Abs Immature Granulocytes: 0.02 K/uL (ref 0.00–0.07)
Basophils Absolute: 0.1 K/uL (ref 0.0–0.1)
Basophils Relative: 1 %
Eosinophils Absolute: 0.1 K/uL (ref 0.0–0.5)
Eosinophils Relative: 1 %
HCT: 47.1 % — ABNORMAL HIGH (ref 36.0–46.0)
Hemoglobin: 15.4 g/dL — ABNORMAL HIGH (ref 12.0–15.0)
Immature Granulocytes: 0 %
Lymphocytes Relative: 25 %
Lymphs Abs: 2.2 K/uL (ref 0.7–4.0)
MCH: 27.9 pg (ref 26.0–34.0)
MCHC: 32.7 g/dL (ref 30.0–36.0)
MCV: 85.3 fL (ref 80.0–100.0)
Monocytes Absolute: 0.5 K/uL (ref 0.1–1.0)
Monocytes Relative: 6 %
Neutro Abs: 5.9 K/uL (ref 1.7–7.7)
Neutrophils Relative %: 67 %
Platelets: 255 K/uL (ref 150–400)
RBC: 5.52 MIL/uL — ABNORMAL HIGH (ref 3.87–5.11)
RDW: 13.2 % (ref 11.5–15.5)
WBC: 8.8 K/uL (ref 4.0–10.5)
nRBC: 0 % (ref 0.0–0.2)

## 2024-01-03 NOTE — ED Triage Notes (Addendum)
 Pt reports blurred vision L left eye that began Thursday, pt was seen at eye dr today and told her that her muscle movement in left eye is not normal and her pressure is up in her eye. Pt denies numbness or weakness in extremities, speech clear.

## 2024-01-04 ENCOUNTER — Emergency Department

## 2024-01-04 ENCOUNTER — Other Ambulatory Visit: Payer: Self-pay

## 2024-01-04 DIAGNOSIS — R29818 Other symptoms and signs involving the nervous system: Secondary | ICD-10-CM | POA: Diagnosis not present

## 2024-01-04 DIAGNOSIS — H538 Other visual disturbances: Secondary | ICD-10-CM | POA: Diagnosis not present

## 2024-01-04 DIAGNOSIS — Z471 Aftercare following joint replacement surgery: Secondary | ICD-10-CM | POA: Diagnosis not present

## 2024-01-04 DIAGNOSIS — R9082 White matter disease, unspecified: Secondary | ICD-10-CM | POA: Diagnosis not present

## 2024-01-04 LAB — URINALYSIS, W/ REFLEX TO CULTURE (INFECTION SUSPECTED)
Bilirubin Urine: NEGATIVE
Glucose, UA: >=500 mg/dL — AB
Hgb urine dipstick: NEGATIVE
Ketones, ur: 5 mg/dL — AB
Nitrite: POSITIVE — AB
Protein, ur: NEGATIVE mg/dL
Specific Gravity, Urine: 1.027 (ref 1.005–1.030)
WBC, UA: >50 WBC/hpf (ref 0–5)
pH: 5 (ref 5.0–8.0)

## 2024-01-04 MED ORDER — CEPHALEXIN 500 MG PO CAPS
500.0000 mg | ORAL_CAPSULE | Freq: Once | ORAL | Status: AC
  Administered 2024-01-04: 500 mg via ORAL
  Filled 2024-01-04: qty 1

## 2024-01-04 MED ORDER — CEPHALEXIN 500 MG PO CAPS
500.0000 mg | ORAL_CAPSULE | Freq: Two times a day (BID) | ORAL | 0 refills | Status: DC
  Filled 2024-01-04: qty 14, 7d supply, fill #0

## 2024-01-04 MED ORDER — SEMAGLUTIDE (1 MG/DOSE) 4 MG/3ML ~~LOC~~ SOPN
1.0000 mg | PEN_INJECTOR | SUBCUTANEOUS | 3 refills | Status: DC
  Filled 2024-01-04: qty 6, 56d supply, fill #0
  Filled 2024-01-05: qty 9, 84d supply, fill #0
  Filled 2024-03-26 – 2024-03-27 (×2): qty 3, 28d supply, fill #1

## 2024-01-04 MED ORDER — SODIUM CHLORIDE 0.9 % IV SOLN: 2.0000 g | Freq: Once | INTRAVENOUS | Status: DC

## 2024-01-04 MED ORDER — LORAZEPAM 1 MG PO TABS
1.0000 mg | ORAL_TABLET | Freq: Once | ORAL | Status: AC
  Administered 2024-01-04: 1 mg via ORAL
  Filled 2024-01-04: qty 1

## 2024-01-04 NOTE — Discharge Instructions (Addendum)
 MRI of your brain showed no stroke.  Please follow-up with your PCP and ophthalmologist.  You do have a urinary tract infection which we are treating today.

## 2024-01-04 NOTE — ED Notes (Signed)
 Patient transported to MRI

## 2024-01-04 NOTE — ED Provider Notes (Signed)
 [facility] Provider Note    Event Date/Time   First MD Initiated Contact with Patient 01/04/24 0001     (approximate)   History   Blurred Vision   HPI  Patricia Sherman is a 63 y.o. female with history of hypertension, diabetes, hyperlipidemia who presents to the emergency department with concerns for possible stroke.  Reports on Thursday 1 week ago she started developing blurry vision in the left eye.  Saw an ophthalmologist at [facility] today and was told to come to the emergency department for further evaluation.  She denies any headache, head injury.  No vision loss, eye pain.  No numbness, tingling or focal weakness.  No prior history of stroke.   History provided by patient, sister.    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 Authorizing Provider  atorvastatin  (LIPITOR) 10 MG tablet Take 10 mg by mouth daily.    [provider]  atorvastatin  (LIPITOR) 10 MG tablet Take 1 (one) tablet by mouth once daily. 02/21/23     atorvastatin  (LIPITOR) 10 MG tablet Take 1 tablet (10 mg total) by mouth 3 (three) times a week. 08/25/23     Blood Glucose Monitoring Suppl (FREESTYLE FREEDOM LITE) w/Device KIT Use as directed to test twice daily 09/02/21     dapagliflozin  propanediol (FARXIGA ) 10 MG TABS tablet Take 1 tablet (10 mg  total) by mouth every morning 02/02/21     dapagliflozin  propanediol (FARXIGA ) 10 MG TABS tablet Take 1 tablet (10 mg total) by mouth once daily 01/26/23     dapagliflozin  propanediol (FARXIGA ) 10 MG TABS tablet Take 1 (one) tablet by mouth once daily. 02/21/23     dapagliflozin  propanediol (FARXIGA ) 10 MG TABS tablet Take 1 tablet (10 mg total) by mouth once daily 05/25/23     fexofenadine  (ALLERGY 24-HR) 180 MG tablet Take 1 tablet (180 mg total) by mouth once daily 04/05/21     glimepiride  (AMARYL ) 4 MG tablet Take 1 tablet (4 mg total) by mouth 2 (two) times daily. 02/21/23     glimepiride  (AMARYL ) 4 MG tablet Take 1 tablet (4 mg total) by mouth 2 (two) times daily. 05/25/23     glucose blood (FREESTYLE LITE) test strip Use as directed to test blood sugar two times daily 09/02/21     ibuprofen  (ADVIL ) 800 MG tablet take one tablet by mouth every 6 to 8 hours as needed for pain 04/21/21     Lancets (FREESTYLE) lancets Use 1 each 2 (two) times daily 09/02/21     losartan  (COZAAR ) 25 MG tablet Take 1 tablet (25 mg total) by mouth daily. 12/13/22     Misc Natural Products (ELDERBERRY ZINC/VIT C/IMMUNE) LOZG  02/28/20   [provider]  Multiple Vitamin (MULTIVITAMIN) tablet Take 1 tablet by mouth daily.  [provider]  omeprazole  (PRILOSEC) 20 MG capsule Take 1 capsule (20 mg total) by mouth 2 (two) times daily before a meal. 08/08/22     omeprazole  (PRILOSEC) 20 MG capsule Take 1 capsule (20 mg total) by mouth 2 (two) times daily before meals. 02/21/23     omeprazole  (PRILOSEC) 40 MG capsule Take 1 capsule (40 mg total) by mouth 2 (two) times daily before a meal. 08/25/23     ondansetron  (ZOFRAN ) 4 MG tablet Take 1 tablet (4 mg total) by mouth every 8 (eight) hours as needed for Nausea for up to 27 days 10/11/21     Semaglutide , 1 MG/DOSE, (OZEMPIC , 1 MG/DOSE,) 4 MG/3ML SOPN Inject 1 mg into the skin once a week. 10/06/23       Physical Exam   Triage Vital Signs: ED Triage Vitals   Encounter Vitals Group     BP 01/03/24 1951 (!) 163/107     Girls Systolic BP Percentile --      Girls Diastolic BP Percentile --      Boys Systolic BP Percentile --      Boys Diastolic BP Percentile --      Pulse Rate 01/03/24 1951 (!) 111     Resp 01/03/24 1951 18     Temp 01/03/24 1951 98.1 F (36.7 C)     Temp Source 01/03/24 1951 Oral     SpO2 01/03/24 1951 100 %     Weight 01/03/24 1955 224 lb (101.6 kg)     Height 01/03/24 1955 5' 3 (1.6 m)     Head Circumference --      Peak Flow --      Pain Score 01/03/24 1954 0     Pain Loc --      Pain Education --      Exclude from Growth Chart --     Most recent vital signs: Vitals:   01/04/24 0500 01/04/24 0613  BP: 110/73 (!) 157/96  Pulse: 89 90  Resp: 16 16  Temp:  97.7 F (36.5 C)  SpO2: 94% 97%    CONSTITUTIONAL: Alert, responds appropriately to questions. Well-appearing; well-nourished HEAD: Normocephalic, atraumatic EYES: Conjunctivae clear, pupils appear equal, sclera nonicteric, no chemosis, no signs of endophthalmitis, no tearing or drainage, extraocular movements intact, normal visual fields, please see nursing notes for visual acuity ENT: normal nose; moist mucous membranes NECK: Supple, normal ROM CARD: RRR; S1 and S2 appreciated RESP: Normal chest excursion without splinting or tachypnea; breath sounds clear and equal bilaterally; no wheezes, no rhonchi, no rales, no hypoxia or respiratory distress, speaking full sentences ABD/GI: Non-distended; soft, non-tender, no rebound, no guarding, no peritoneal signs BACK: The back appears normal EXT: Normal ROM in all joints; no deformity noted, no edema SKIN: Normal color for age and race; warm; no rash on exposed skin NEURO: Moves all extremities equally, normal speech, normal sensation diffusely, cranial nerves II through XII intact PSYCH: The patient's mood and manner are appropriate.   ED Results / Procedures / Treatments   LABS: (all labs ordered are  listed, but only abnormal results are displayed) Labs Reviewed  CBC WITH DIFFERENTIAL/PLATELET - Abnormal; Notable for the following components:      Result Value   RBC 5.52 (*)    Hemoglobin 15.4 (*)    HCT 47.1 (*)    All other components within normal limits  BASIC METABOLIC PANEL WITH GFR - Abnormal; Notable for the following components:   CO2 18 (*)    Glucose,  Bld 173 (*)    All other components within normal limits  URINALYSIS, W/ REFLEX TO CULTURE (INFECTION SUSPECTED) - Abnormal; Notable for the following components:   Color, Urine YELLOW (*)    APPearance CLOUDY (*)    Glucose, UA >=500 (*)    Ketones, ur 5 (*)    Nitrite POSITIVE (*)    Leukocytes,Ua LARGE (*)    Bacteria, UA MANY (*)    All other components within normal limits  URINE CULTURE     EKG:  EKG Interpretation Date/Time:  Thursday January 04 2024 05:28:55 EDT Ventricular Rate:  94 PR Interval:  162 QRS Duration:  84 QT Interval:  354 QTC Calculation: 442 R Axis:   -4  Text Interpretation: Normal sinus rhythm Septal infarct (cited on or before 25-Nov-2015) Abnormal ECG When compared with ECG of 25-Nov-2015 15:43, Questionable change in initial forces of Septal leads Confirmed by Neomi Neptune [phone]) on 01/04/2024 5:30:32 AM         RADIOLOGY: My personal review and interpretation of imaging: MRI brain, head CT showed no acute abnormality.  I have personally reviewed all radiology reports.   MR BRAIN WO CONTRAST Result Date: 01/04/2024 EXAM: MRI BRAIN WITHOUT CONTRAST 01/04/2024 05:53:00 AM TECHNIQUE: Multiplanar multisequence MRI of the head/brain was performed without the administration of intravenous contrast. COMPARISON: CT of the head dated 01/03/2024. CLINICAL HISTORY: Neuro deficit, acute, stroke suspected. Blurred vision per patient; eval for TIA. FINDINGS: BRAIN AND VENTRICLES: No acute infarct. No intracranial hemorrhage. No mass. No midline shift. No hydrocephalus. The sella is  unremarkable. Normal flow voids. Minimal subcortical white matter disease present. ORBITS: No acute abnormality. The patient is status post bilateral lens replacement. SINUSES AND MASTOIDS: No acute abnormality. BONES AND SOFT TISSUES: Normal marrow signal. No acute soft tissue abnormality. IMPRESSION: 1. No acute intracranial abnormality. 2. Minimal subcortical white matter disease. Electronically signed by: Evalene Coho MD 01/04/2024 06:01 AM EDT RP Workstation: [id number]   CT Head Wo Contrast Result Date: 01/03/2024 CLINICAL DATA:  Blurry vision. EXAM: CT HEAD WITHOUT CONTRAST TECHNIQUE: Contiguous axial images were obtained from the base of the skull through the vertex without intravenous contrast. RADIATION DOSE REDUCTION: This exam was performed according to the departmental dose-optimization program which includes automated exposure control, adjustment of the mA and/or kV according to patient size and/or use of iterative reconstruction technique. COMPARISON:  09/15/2011 FINDINGS: Brain: No intracranial hemorrhage, mass effect, or midline shift. No hydrocephalus. The basilar cisterns are patent. No evidence of territorial infarct or acute ischemia. No extra-axial or intracranial fluid collection. Vascular: No hyperdense vessel. Skull: No fracture or focal lesion. Sinuses/Orbits: Paranasal sinuses and mastoid air cells are clear. The visualized orbits are unremarkable. Bilateral lens resection. Other: None. IMPRESSION: Negative noncontrast head CT. Electronically Signed   By: Andrea Gasman M.D.   On: 01/03/2024 21:03     PROCEDURES:  Critical Care performed: No      .1-3 Lead EKG Interpretation  Performed by: Meira Wahba, Neptune SAILOR, DO Authorized by: Blen Ransome, Neptune SAILOR, DO     Interpretation: normal     ECG rate:  91   ECG rate assessment: normal     Rhythm: sinus rhythm     Ectopy: none     Conduction: normal       IMPRESSION / MDM / ASSESSMENT AND PLAN / ED COURSE  I reviewed the  triage vital signs and the nursing notes.    Patient here with concerns for left eye vision changes.  Ongoing  for 1 week.  No other focal neurodeficits.  States she saw ophthalmology today.  The patient is on the cardiac monitor to evaluate for evidence of arrhythmia and/or significant heart rate changes.   DIFFERENTIAL DIAGNOSIS (includes but not limited to):   CVA, TIA, intracranial hemorrhage, complex migraine, doubt corneal abrasion, corneal ulceration, endophthalmitis, glaucoma   Patient's presentation is most consistent with acute presentation with potential threat to life or bodily function.   PLAN: Will obtain labs, EKG, urine, MRI brain.  CT head obtained from triage and reviewed/interpreted by myself and the radiologist and is unremarkable.  Patient denies any pain, head injury, headache.  Will give Ativan  prior to MRI.   MEDICATIONS GIVEN IN ED: Medications  LORazepam  (ATIVAN ) tablet 1 mg (1 mg Oral Given 01/04/24 0517)  cephALEXin  (KEFLEX ) capsule 500 mg (500 mg Oral Given 01/04/24 0517)     ED COURSE: Normal hemoglobin, electrolytes, glucose.  Patient does appear to have a nitrite positive UTI.  Culture pending.  Will give Keflex .  MRI brain reviewed and interpreted by myself and the radiologist and shows no acute abnormality.  Patient very reassured.  Doubt TIA.  Symptoms seem atypical and are still present but improved.  She has follow-up already scheduled with her ophthalmologist.  Also recommended close follow-up with her PCP.   At this time, I do not feel there is any life-threatening condition present. I reviewed all nursing notes, vitals, pertinent previous records.  All lab and urine results, EKGs, imaging ordered have been independently reviewed and interpreted by myself.  I reviewed all available radiology reports from any imaging ordered this visit.  Based on my assessment, I feel the patient is safe to be discharged home without further emergent workup and can  continue workup as an outpatient as needed. Discussed all findings, treatment plan as well as usual and customary return precautions.  They verbalize understanding and are comfortable with this plan.  Outpatient follow-up has been provided as needed.  All questions have been answered.    CONSULTS:  none   OUTSIDE RECORDS REVIEWED: Reviewed recent internal medicine notes.       FINAL CLINICAL IMPRESSION(S) / ED DIAGNOSES   Final diagnoses:  Blurred vision, left eye  Acute UTI     Rx / DC Orders   ED Discharge Orders          Ordered    cephALEXin  (KEFLEX ) 500 MG capsule  2 times daily        01/04/24 0513             Note:  This document was prepared using Dragon voice recognition software and may include unintentional dictation errors.   Izetta Sakamoto, Josette SAILOR, DO 01/04/24 [phone]

## 2024-01-05 ENCOUNTER — Other Ambulatory Visit: Payer: Self-pay

## 2024-01-05 ENCOUNTER — Other Ambulatory Visit (HOSPITAL_COMMUNITY): Payer: Self-pay

## 2024-01-06 LAB — URINE CULTURE: Culture: >=100000 — AB

## 2024-01-08 DIAGNOSIS — H40053 Ocular hypertension, bilateral: Secondary | ICD-10-CM | POA: Diagnosis not present

## 2024-01-22 ENCOUNTER — Ambulatory Visit: Admitting: Obstetrics & Gynecology

## 2024-01-22 ENCOUNTER — Other Ambulatory Visit: Payer: Self-pay

## 2024-01-22 DIAGNOSIS — I10 Essential (primary) hypertension: Secondary | ICD-10-CM | POA: Diagnosis not present

## 2024-01-22 DIAGNOSIS — H5310 Unspecified subjective visual disturbances: Secondary | ICD-10-CM | POA: Diagnosis not present

## 2024-01-22 DIAGNOSIS — H538 Other visual disturbances: Secondary | ICD-10-CM | POA: Diagnosis not present

## 2024-01-22 DIAGNOSIS — E1165 Type 2 diabetes mellitus with hyperglycemia: Secondary | ICD-10-CM | POA: Diagnosis not present

## 2024-01-22 DIAGNOSIS — Z6839 Body mass index (BMI) 39.0-39.9, adult: Secondary | ICD-10-CM | POA: Diagnosis not present

## 2024-01-22 DIAGNOSIS — H547 Unspecified visual loss: Secondary | ICD-10-CM | POA: Diagnosis not present

## 2024-01-22 DIAGNOSIS — E7800 Pure hypercholesterolemia, unspecified: Secondary | ICD-10-CM | POA: Diagnosis not present

## 2024-01-22 MED ORDER — LOSARTAN POTASSIUM 50 MG PO TABS
50.0000 mg | ORAL_TABLET | Freq: Every day | ORAL | 1 refills | Status: DC
  Filled 2024-01-22: qty 90, 90d supply, fill #0
  Filled 2024-04-17: qty 90, 90d supply, fill #1

## 2024-01-22 MED ORDER — DAPAGLIFLOZIN PROPANEDIOL 5 MG PO TABS
5.0000 mg | ORAL_TABLET | Freq: Every day | ORAL | 1 refills | Status: DC
  Filled 2024-01-22: qty 90, 90d supply, fill #0

## 2024-01-23 ENCOUNTER — Other Ambulatory Visit: Payer: Self-pay

## 2024-01-24 ENCOUNTER — Other Ambulatory Visit: Payer: Self-pay

## 2024-01-25 ENCOUNTER — Other Ambulatory Visit: Payer: Self-pay

## 2024-01-26 ENCOUNTER — Other Ambulatory Visit: Payer: Self-pay

## 2024-01-29 ENCOUNTER — Other Ambulatory Visit: Payer: Self-pay

## 2024-01-29 MED ORDER — PRECISION QID TEST VI STRP
ORAL_STRIP | 1 refills | Status: AC
  Filled 2024-01-29: qty 200, 90d supply, fill #0

## 2024-02-07 DIAGNOSIS — H43812 Vitreous degeneration, left eye: Secondary | ICD-10-CM | POA: Diagnosis not present

## 2024-02-07 DIAGNOSIS — H43811 Vitreous degeneration, right eye: Secondary | ICD-10-CM | POA: Diagnosis not present

## 2024-02-09 ENCOUNTER — Other Ambulatory Visit: Payer: Self-pay

## 2024-02-13 ENCOUNTER — Other Ambulatory Visit: Payer: Self-pay

## 2024-02-13 DIAGNOSIS — H40053 Ocular hypertension, bilateral: Secondary | ICD-10-CM | POA: Diagnosis not present

## 2024-02-13 DIAGNOSIS — H538 Other visual disturbances: Secondary | ICD-10-CM | POA: Diagnosis not present

## 2024-02-13 DIAGNOSIS — Z961 Presence of intraocular lens: Secondary | ICD-10-CM | POA: Diagnosis not present

## 2024-02-13 MED ORDER — CEPHALEXIN 500 MG PO CAPS
500.0000 mg | ORAL_CAPSULE | Freq: Two times a day (BID) | ORAL | 0 refills | Status: DC
  Filled 2024-02-13: qty 14, 7d supply, fill #0

## 2024-02-20 ENCOUNTER — Ambulatory Visit (INDEPENDENT_AMBULATORY_CARE_PROVIDER_SITE_OTHER): Admitting: Obstetrics & Gynecology

## 2024-02-20 VITALS — BP 166/97 | HR 91 | Ht 62.0 in | Wt 223.8 lb

## 2024-02-20 DIAGNOSIS — Z01419 Encounter for gynecological examination (general) (routine) without abnormal findings: Secondary | ICD-10-CM

## 2024-02-20 NOTE — Progress Notes (Signed)
 ANNUAL PREVENTATIVE CARE GYNECOLOGY  ENCOUNTER NOTE  Subjective:       Patricia Sherman is a 63 y.o. [phone] (95 and 39 yo kids, 2 grandsons)  here for a routine annual gynecologic exam. The patient is not sexually active. She has been abstinent for about 18 months.  The patient is not taking hormone replacement therapy. Patient denies post-menopausal vaginal bleeding. The patient wears seatbelts: yes. The patient participates in regular exercise: no. Has the patient ever been transfused or tattooed?: no. The patient reports that there is not domestic violence in her life.  Current complaints: 1.  She denies any current gyn concerns.    Gynecologic History No LMP recorded. Patient is postmenopausal.  Last Pap: 2022. Results were: normal, She denies any h/o abnormal paps. Last mammogram: 12/2023. 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     Hyperlipidemia    Hypertension    Uterine leiomyoma 02/09/2016    Family History  Problem Relation Age of Onset   Heart disease Mother    Hypertension Mother    Diabetes Sister    Hypertension Sister    Heart disease Brother    Hypertension Brother    Diabetes Daughter    Breast cancer Neg Hx    Ovarian cancer Neg Hx    Colon cancer Neg Hx     Past Surgical History:  Procedure Laterality Date   CATARACT EXTRACTION     COLONOSCOPY WITH PROPOFOL  N/A 02/04/2021   Procedure: COLONOSCOPY WITH  PROPOFOL ;  Surgeon: Onita Elspeth Sharper, DO;  Location: [facility] ENDOSCOPY;  Service: Gastroenterology;  Laterality: N/A;   TONSILLECTOMY     TUBAL LIGATION      Social History   Socioeconomic History   Marital status: Single    Spouse name: Not on file   Number of children: Not on file   Years of education: Not on file   Highest education level: Not on file  Occupational History   Not on file  Tobacco Use   Smoking status: Never   Smokeless tobacco: Never  Vaping Use   Vaping status: Never Used  Substance and Sexual Activity   Alcohol  use: No    Alcohol /week: 0.0 standard drinks of alcohol    Drug use: No   Sexual activity: Not Currently    Partners: Male    Birth control/protection: Post-menopausal  Other Topics Concern   Not on file  Social History Narrative   Not on file   Social Drivers of Health   Financial Resource Strain: Low Risk  (08/25/2023)   Received from [facility]  Overall Financial Resource Strain (CARDIA)    Difficulty of Paying Living Expenses: Not hard at all  Food Insecurity: No Food Insecurity (08/25/2023)   Received from [facility]   Hunger Vital Sign    Within the past 12 months, you worried that your food would run out before you got the money to buy more.: Never true    Within the past 12 months, the food you bought just didn't last and you didn't have money to get more.: Never true  Transportation Needs: No Transportation Needs (08/25/2023)   Received from [facility]   PRAPARE - Transportation    Lack of Transportation (Non-Medical): No    In the past 12 months, has lack of transportation kept you from medical appointments or from getting medications?: No  Physical Activity: Inactive (11/07/2018)   Exercise Vital Sign    Days of Exercise per Week: 0 days    Minutes of Exercise per Session: 0 min  Stress: No Stress Concern Present (11/07/2018)   [employer] of Occupational Health -  Occupational Stress Questionnaire    Feeling of Stress : Not at all  Social Connections: Not on file  Intimate Partner Violence: Not At Risk (11/07/2018)   Humiliation, Afraid, Rape, and Kick questionnaire    Fear of Current or Ex-Partner: No    Emotionally Abused: No    Physically Abused: No    Sexually Abused: No    Current Outpatient Medications on File Prior to Visit  Medication Sig Dispense Refill   atorvastatin  (LIPITOR) 10 MG tablet Take 10 mg by mouth daily.     atorvastatin  (LIPITOR) 10 MG tablet Take 1 (one) tablet by mouth once daily. 90 tablet 1   atorvastatin  (LIPITOR) 10 MG tablet Take 1 tablet (10 mg total) by mouth 3 (three) times a week. 45 tablet 1   Blood Glucose Monitoring Suppl (FREESTYLE FREEDOM LITE) w/Device KIT Use as directed to test twice daily 1 kit 0   cephALEXin  (KEFLEX ) 500 MG capsule Take 1 capsule (500 mg total) by mouth 2 (two) times daily. 14 capsule 0   dapagliflozin  propanediol (FARXIGA ) 10 MG TABS tablet Take 1 tablet (10 mg total) by mouth every morning 90 tablet 3   dapagliflozin  propanediol (FARXIGA ) 10 MG TABS tablet Take 1 tablet (10 mg total) by mouth once daily 90 tablet 1   dapagliflozin  propanediol (FARXIGA ) 10 MG TABS tablet Take 1 (one) tablet by mouth once daily. 90 tablet 1   dapagliflozin  propanediol (FARXIGA ) 5 MG TABS tablet Take 1 tablet (5 mg total) by mouth daily. 90 tablet 1   fexofenadine  (ALLERGY 24-HR) 180 MG tablet Take 1 tablet (180 mg total) by mouth once daily 90 tablet 3   glimepiride  (AMARYL ) 4 MG tablet Take 1 tablet (4 mg total) by mouth 2 (two) times daily. 180 tablet 1   glimepiride  (AMARYL ) 4 MG tablet Take 1 tablet (4 mg total) by mouth 2 (two) times daily. 180 tablet 1   glucose blood (FREESTYLE LITE) test strip Use as directed to test blood sugar two times daily 200 each 0   glucose blood (PRECISION QID TEST) test strip Use 2 (two) times daily as instructed. 200 each 1   ibuprofen  (ADVIL ) 800 MG tablet take one tablet  by mouth every 6 to 8 hours as needed for pain 30 tablet 0   Lancets (FREESTYLE) lancets Use 1 each 2 (two) times daily 200 each 3   losartan  (COZAAR ) 25 MG tablet Take 1  tablet (25 mg total) by mouth daily. 90 tablet 1   losartan  (COZAAR ) 50 MG tablet Take 1 tablet (50 mg total) by mouth once daily 90 tablet 1   Misc Natural Products (ELDERBERRY ZINC/VIT C/IMMUNE) LOZG      Multiple Vitamin (MULTIVITAMIN) tablet Take 1 tablet by mouth daily.     omeprazole  (PRILOSEC) 20 MG capsule Take 1 capsule (20 mg total) by mouth 2 (two) times daily before a meal. 180 capsule 1   omeprazole  (PRILOSEC) 20 MG capsule Take 1 capsule (20 mg total) by mouth 2 (two) times daily before meals. 180 capsule 1   omeprazole  (PRILOSEC) 40 MG capsule Take 1 capsule (40 mg total) by mouth 2 (two) times daily before a meal. 180 capsule 1   ondansetron  (ZOFRAN ) 4 MG tablet Take 1 tablet (4 mg total) by mouth every 8 (eight) hours as needed for Nausea for up to 27 days 20 tablet 3   Semaglutide , 1 MG/DOSE, (OZEMPIC , 1 MG/DOSE,) 4 MG/3ML SOPN Inject 1 mg into the skin once a week. 3 mL 3   No current facility-administered medications on file prior to visit.    Allergies  Allergen Reactions   Metformin Diarrhea and Other (See Comments)    Other reaction(s): Other (See Comments) Elevated heart rate Elevated heart rate   Sulfa Antibiotics Rash      Review of Systems ROS Review of Systems - General ROS: negative for - chills, fatigue, fever, hot flashes, night sweats, weight gain or weight loss Psychological ROS: negative for - anxiety, decreased libido, depression, mood swings, physical abuse or sexual abuse Ophthalmic ROS: negative for - blurry vision, eye pain or loss of vision ENT ROS: negative for - headaches, hearing change, visual changes or vocal changes Allergy and Immunology ROS: negative for - hives, itchy/watery eyes or seasonal allergies Hematological and Lymphatic ROS: negative for - bleeding problems,  bruising, swollen lymph nodes or weight loss Endocrine ROS: negative for - galactorrhea, hair pattern changes, hot flashes, malaise/lethargy, mood swings, palpitations, polydipsia/polyuria, skin changes, temperature intolerance or unexpected weight changes Breast ROS: negative for - new or changing breast lumps or nipple discharge Respiratory ROS: negative for - cough or shortness of breath Cardiovascular ROS: negative for - chest pain, irregular heartbeat, palpitations or shortness of breath Gastrointestinal ROS: no abdominal pain, change in bowel habits, or black or bloody stools Genito-Urinary ROS: no dysuria, trouble voiding, or hematuria Musculoskeletal ROS: negative for - joint pain or joint stiffness Neurological ROS: negative for - bowel and bladder control changes Dermatological ROS: negative for rash and skin lesion changes   Objective:   BP (!) 166/97 (BP Location: Left Arm, Patient Position: Sitting, Cuff Size: Normal)   Pulse 91   Ht 5' 2 (1.575 m)   Wt 223 lb 12.8 oz (101.5 kg)   BMI 40.93 kg/m  CONSTITUTIONAL: Well-developed, well-nourished female in no acute distress.  PSYCHIATRIC: Normal mood and affect. Normal behavior. Normal judgment and thought content. NEUROLGIC: Alert and oriented to person, place, and time. Normal muscle tone coordination. No cranial nerve deficit noted. HENT:  Normocephalic, atraumatic, External right and left ear normal. Oropharynx is clear and moist EYES: Conjunctivae and EOM are normal. Pupils are equal, round, and reactive to light. No scleral icterus.  NECK: Normal range of motion, supple, no masses.  Normal thyroid .  SKIN: Skin is warm and dry. No rash noted. Not diaphoretic. No erythema. No pallor. CARDIOVASCULAR: Normal heart rate noted, regular rhythm, no murmur. RESPIRATORY: Clear to auscultation bilaterally.  Effort and breath sounds normal, no problems with respiration noted. BREASTS: Symmetric in size. No masses, skin changes, nipple  drainage, or lymphadenopathy. ABDOMEN: Soft, normal bowel sounds, no distention noted.  No tenderness, rebound or guarding.  EG- raging yeast infection (She is finishing ABX and has a prescription for diflucan ) Grade 4 cystocele, Grade 3 uterine prolapse (She was noted to have prolapse at her annual last year. She denies symptoms at this time.) Bimanual exam- normal size and shape, anteverted uterus, normal adnexal exam (non-palpable)  MUSCULOSKELETAL: Normal range of motion. No tenderness.  No cyanosis, clubbing, or edema.  2+ distal pulses. LYMPHATIC: No Axillary, Supraclavicular, or Inguinal Adenopathy.   Labs: Lab Results  Component Value Date   WBC 8.8 01/03/2024   HGB 15.4 (H) 01/03/2024   HCT 47.1 (H) 01/03/2024   MCV 85.3 01/03/2024   PLT 255 01/03/2024    Lab Results  Component Value Date   CREATININE 0.85 01/03/2024   BUN 14 01/03/2024   NA 137 01/03/2024   K 3.8 01/03/2024   CL 107 01/03/2024   CO2 18 (L) 01/03/2024    Lab Results  Component Value Date   ALT 29 09/15/2011   AST 23 09/15/2011   ALKPHOS 73 09/15/2011   BILITOT 0.2 09/15/2011       Assessment:   1. Well woman exam with routine gynecological exam   2.      POP- she is asymptomatic at this time and will call the office if she has concerns.   Plan:  Pap: Not done Mammogram: UTD Colon Screening:  UTD  Routine preventative health maintenance measures emphasized: Exercise/Diet/Weight control Flu vaccine status: already got it this year  Return to Clinic - 1 Year   Harland JAYSON Birkenhead, MD [facility]

## 2024-02-28 DIAGNOSIS — N390 Urinary tract infection, site not specified: Secondary | ICD-10-CM | POA: Diagnosis not present

## 2024-02-28 DIAGNOSIS — B9620 Unspecified Escherichia coli [E. coli] as the cause of diseases classified elsewhere: Secondary | ICD-10-CM | POA: Diagnosis not present

## 2024-02-28 DIAGNOSIS — E1165 Type 2 diabetes mellitus with hyperglycemia: Secondary | ICD-10-CM | POA: Diagnosis not present

## 2024-02-28 DIAGNOSIS — Z6841 Body Mass Index (BMI) 40.0 and over, adult: Secondary | ICD-10-CM | POA: Diagnosis not present

## 2024-02-28 DIAGNOSIS — I10 Essential (primary) hypertension: Secondary | ICD-10-CM | POA: Diagnosis not present

## 2024-02-28 DIAGNOSIS — J302 Other seasonal allergic rhinitis: Secondary | ICD-10-CM | POA: Diagnosis not present

## 2024-02-28 DIAGNOSIS — R8290 Unspecified abnormal findings in urine: Secondary | ICD-10-CM | POA: Diagnosis not present

## 2024-03-04 ENCOUNTER — Other Ambulatory Visit: Payer: Self-pay

## 2024-03-04 MED ORDER — CEFUROXIME AXETIL 250 MG PO TABS
250.0000 mg | ORAL_TABLET | Freq: Two times a day (BID) | ORAL | 0 refills | Status: AC
  Filled 2024-03-04: qty 10, 5d supply, fill #0

## 2024-03-05 ENCOUNTER — Other Ambulatory Visit: Payer: Self-pay

## 2024-03-06 DIAGNOSIS — Z0000 Encounter for general adult medical examination without abnormal findings: Secondary | ICD-10-CM | POA: Diagnosis not present

## 2024-03-06 DIAGNOSIS — E7800 Pure hypercholesterolemia, unspecified: Secondary | ICD-10-CM | POA: Diagnosis not present

## 2024-03-06 DIAGNOSIS — Z1339 Encounter for screening examination for other mental health and behavioral disorders: Secondary | ICD-10-CM | POA: Diagnosis not present

## 2024-03-06 DIAGNOSIS — I10 Essential (primary) hypertension: Secondary | ICD-10-CM | POA: Diagnosis not present

## 2024-03-06 DIAGNOSIS — E1165 Type 2 diabetes mellitus with hyperglycemia: Secondary | ICD-10-CM | POA: Diagnosis not present

## 2024-03-06 DIAGNOSIS — Z1331 Encounter for screening for depression: Secondary | ICD-10-CM | POA: Diagnosis not present

## 2024-03-06 DIAGNOSIS — Z6841 Body Mass Index (BMI) 40.0 and over, adult: Secondary | ICD-10-CM | POA: Diagnosis not present

## 2024-03-06 DIAGNOSIS — N390 Urinary tract infection, site not specified: Secondary | ICD-10-CM | POA: Diagnosis not present

## 2024-03-06 DIAGNOSIS — B9620 Unspecified Escherichia coli [E. coli] as the cause of diseases classified elsewhere: Secondary | ICD-10-CM | POA: Diagnosis not present

## 2024-03-19 ENCOUNTER — Other Ambulatory Visit: Payer: Self-pay

## 2024-03-19 DIAGNOSIS — R399 Unspecified symptoms and signs involving the genitourinary system: Secondary | ICD-10-CM | POA: Diagnosis not present

## 2024-03-20 ENCOUNTER — Other Ambulatory Visit: Payer: Self-pay

## 2024-03-21 ENCOUNTER — Other Ambulatory Visit: Payer: Self-pay

## 2024-03-22 ENCOUNTER — Other Ambulatory Visit: Payer: Self-pay

## 2024-03-25 ENCOUNTER — Other Ambulatory Visit: Payer: Self-pay

## 2024-03-26 ENCOUNTER — Other Ambulatory Visit: Payer: Self-pay

## 2024-03-26 MED ORDER — FLUCONAZOLE 100 MG PO TABS
100.0000 mg | ORAL_TABLET | Freq: Every day | ORAL | 0 refills | Status: AC
Start: 2024-03-26 — End: ?
  Filled 2024-03-26: qty 5, 5d supply, fill #0

## 2024-03-26 MED ORDER — NITROFURANTOIN MONOHYD MACRO 100 MG PO CAPS
100.0000 mg | ORAL_CAPSULE | Freq: Two times a day (BID) | ORAL | 0 refills | Status: AC
  Filled 2024-03-26: qty 14, 7d supply, fill #0

## 2024-03-27 ENCOUNTER — Other Ambulatory Visit: Payer: Self-pay

## 2024-04-03 ENCOUNTER — Other Ambulatory Visit: Payer: Self-pay

## 2024-04-17 ENCOUNTER — Other Ambulatory Visit: Payer: Self-pay

## 2024-04-29 ENCOUNTER — Other Ambulatory Visit (HOSPITAL_BASED_OUTPATIENT_CLINIC_OR_DEPARTMENT_OTHER): Payer: Self-pay

## 2024-04-29 ENCOUNTER — Other Ambulatory Visit: Payer: Self-pay

## 2024-04-29 MED ORDER — SEMAGLUTIDE (1 MG/DOSE) 4 MG/3ML ~~LOC~~ SOPN
1.0000 mg | PEN_INJECTOR | SUBCUTANEOUS | 3 refills | Status: AC
  Filled 2024-04-29: qty 3, 28d supply, fill #0
  Filled 2024-05-21 – 2024-05-22 (×2): qty 3, 28d supply, fill #1

## 2024-05-03 ENCOUNTER — Ambulatory Visit: Admitting: Urology

## 2024-05-07 ENCOUNTER — Encounter: Payer: Self-pay | Admitting: Urology

## 2024-05-07 ENCOUNTER — Ambulatory Visit (INDEPENDENT_AMBULATORY_CARE_PROVIDER_SITE_OTHER): Admitting: Urology

## 2024-05-07 VITALS — BP 152/93 | HR 103 | Ht 62.0 in | Wt 227.0 lb

## 2024-05-07 DIAGNOSIS — R8281 Pyuria: Secondary | ICD-10-CM

## 2024-05-07 DIAGNOSIS — R3129 Other microscopic hematuria: Secondary | ICD-10-CM | POA: Diagnosis not present

## 2024-05-07 LAB — MICROSCOPIC EXAMINATION: WBC, UA: >30 /HPF — AB (ref 0–5)

## 2024-05-07 LAB — URINALYSIS, COMPLETE
Bilirubin, UA: NEGATIVE
Glucose, UA: NEGATIVE
Ketones, UA: NEGATIVE
Nitrite, UA: POSITIVE — AB
Protein,UA: NEGATIVE
RBC, UA: NEGATIVE
Specific Gravity, UA: 1.02 (ref 1.005–1.030)
Urobilinogen, Ur: 0.2 mg/dL (ref 0.2–1.0)
pH, UA: 6 (ref 5.0–7.5)

## 2024-05-07 NOTE — Progress Notes (Signed)
 "  05/07/2024 8:41 AM   Patricia Sherman 04/23/1961 [id number]  Referring provider: Sadie Manna, MD [address] [facility] [place],  [place] [address]  Chief Complaint  Patient presents with   Hematuria   Urologic history: 1.  Microhematuria CT urogram/cystoscopy May/July 2025 showed no significant abnormalities   HPI: Patricia Sherman is a 64 y.o. female presents for 30-month follow-up visit.  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     Hyperlipidemia    Hypertension    Uterine leiomyoma 02/09/2016    Surgical History: Past Surgical History:  Procedure Laterality Date   CATARACT EXTRACTION     COLONOSCOPY WITH PROPOFOL  N/A 02/04/2021   Procedure: COLONOSCOPY WITH PROPOFOL ;  Surgeon: Onita Elspeth Sharper, DO;  Location: [facility] ENDOSCOPY;  Service: Gastroenterology;  Laterality: N/A;   TONSILLECTOMY     TUBAL LIGATION      Home Medications:  Allergies as of 05/07/2024       Reactions   Metformin Diarrhea, Other (See Comments)   Other reaction(s): Other (See Comments) Elevated heart rate Elevated heart rate   Sulfa Antibiotics Rash        Medication List        Accurate as of May 07, 2024  8:41 AM. If you have any questions, ask your nurse or doctor.          STOP taking these medications    cephALEXin  500 MG capsule Commonly known as: KEFLEX  Stopped by: Glendia Barba, MD   FreeStyle Freedom Lite w/Device Kit Stopped by: Glendia Barba, MD   ibuprofen  800 MG tablet Commonly known as: ADVIL  Stopped by: Glendia Barba, MD       TAKE these medications    Allergy Relief 180 MG tablet Generic drug: fexofenadine  Take 1 tablet (180 mg total) by  mouth once daily   atorvastatin  10 MG tablet Commonly known as: LIPITOR Take 1 tablet (10 mg total) by mouth 3 (three) times a week. What changed: Another medication with the same name was removed. Continue taking this medication, and follow the directions you see here. Changed by: Glendia Barba, MD   cefUROXime  250 MG tablet Commonly known as: CEFTIN  Take 1 tablet (250 mg total) by mouth 2 (two) times daily for 5 days   [place] Zinc/Vit C/Immune Lozg   Farxiga  10 MG Tabs tablet Generic drug: dapagliflozin  propanediol Take 1 (one) tablet by mouth once daily. What changed: Another medication with the same name was removed. Continue taking this medication, and follow the directions you see here. Changed by: Glendia Barba, MD   fluconazole  100 MG tablet Commonly known as: DIFLUCAN  Take 1 tablet (100 mg total) by mouth daily.   freestyle lancets Use two times daily as directed (Use 1 each 2 (two) times daily)   FREESTYLE LITE test strip Generic drug: glucose blood Use 2 (two) times daily as instructed. What changed: Another medication with the same name was removed. Continue taking this medication, and follow the directions you see here. Changed by: Glendia Barba, MD   glimepiride  4 MG tablet Commonly known as: AMARYL  Take 1 tablet (4 mg total) by mouth 2 (two) times daily.   glimepiride  4 MG tablet Commonly known as: AMARYL   Take 1 tablet (4 mg total) by mouth 2 (two) times daily.   losartan  50 MG tablet Commonly known as: COZAAR  Take 1 tablet (50 mg total) by mouth once daily What changed: Another medication with the same name was removed. Continue taking this medication, and follow the directions you see here. Changed by: Glendia Barba, MD   multivitamin tablet Take 1 tablet by mouth daily.   omeprazole  20 MG capsule Commonly known as: PRILOSEC Take 1 capsule (20 mg total) by mouth 2 (two) times daily before meals. What changed: Another medication with the same  name was removed. Continue taking this medication, and follow the directions you see here. Changed by: Glendia Barba, MD   omeprazole  40 MG capsule Commonly known as: PRILOSEC Take 1 capsule (40 mg total) by mouth 2 (two) times daily before a meal. What changed: Another medication with the same name was removed. Continue taking this medication, and follow the directions you see here. Changed by: Glendia Barba, MD   ondansetron  4 MG tablet Commonly known as: ZOFRAN  Take one tablet by mouth every 8 hours as needed for nausea (Take 1 tablet (4 mg total) by mouth every 8 (eight) hours as needed for Nausea for up to 27 days)   Ozempic  (1 MG/DOSE) 4 MG/3ML Sopn Generic drug: Semaglutide  (1 MG/DOSE) Inject 1 mg into the skin once a week.        Allergies: Allergies[1]  Family History: Family History  Problem Relation Age of Onset   Heart disease Mother    Hypertension Mother    Diabetes Sister    Hypertension Sister    Heart disease Brother    Hypertension Brother    Diabetes Daughter    Breast cancer Neg Hx    Ovarian cancer Neg Hx    Colon cancer Neg Hx     Social History:  reports that she has never smoked. She has never used smokeless tobacco. She reports that she does not drink alcohol  and does not use drugs.   Physical Exam: BP (!) 152/93   Pulse (!) 103   Ht 5' 2 (1.575 m)   Wt 227 lb (103 kg)   BMI 41.52 kg/m   Constitutional:  Alert, No acute distress. HEENT: [place] AT Respiratory: Normal respiratory effort, no increased work of breathing. Psychiatric: Normal mood and affect.  Laboratory Data:  Urinalysis Dipstick nitrite +; 1+ leukocytes Micro: >30 WBC/0-2 RBC   Assessment & Plan:    1. Microscopic hematuria  No significant RBCs on today's UA  2.  Pyuria Only symptom is increased odor to urine; most likely asymptomatic bacteriuria Urine culture ordered   Glendia JAYSON Barba, MD  [facility] [address], [address] [place], [place] [address] [phone]    [1]  Allergies Allergen Reactions   Metformin Diarrhea and Other (See Comments)    Other reaction(s): Other (See Comments) Elevated heart rate Elevated heart rate   Sulfa Antibiotics Rash   "

## 2024-05-11 LAB — CULTURE, URINE COMPREHENSIVE

## 2024-05-12 ENCOUNTER — Ambulatory Visit: Payer: Self-pay | Admitting: Urology

## 2024-05-13 ENCOUNTER — Other Ambulatory Visit: Payer: Self-pay

## 2024-05-14 ENCOUNTER — Other Ambulatory Visit: Payer: Self-pay

## 2024-05-14 ENCOUNTER — Telehealth: Payer: Self-pay | Admitting: Urology

## 2024-05-14 NOTE — Telephone Encounter (Signed)
 Sent already send to Dr. Twylla.

## 2024-05-14 NOTE — Telephone Encounter (Signed)
 Pt called and left a vm stating they were told they were being prescribed an antibiotic and the antibiotic has not been sent to the pharmacy. Pt requested antibiotic and a call back with clarification.

## 2024-05-15 ENCOUNTER — Other Ambulatory Visit: Payer: Self-pay

## 2024-05-16 ENCOUNTER — Other Ambulatory Visit: Payer: Self-pay

## 2024-05-17 ENCOUNTER — Other Ambulatory Visit: Payer: Self-pay

## 2024-05-20 ENCOUNTER — Other Ambulatory Visit: Payer: Self-pay

## 2024-05-21 ENCOUNTER — Other Ambulatory Visit: Payer: Self-pay

## 2024-05-21 ENCOUNTER — Other Ambulatory Visit: Payer: Self-pay | Admitting: *Deleted

## 2024-05-21 ENCOUNTER — Telehealth: Payer: Self-pay

## 2024-05-21 MED ORDER — NITROFURANTOIN MONOHYD MACRO 100 MG PO CAPS
100.0000 mg | ORAL_CAPSULE | Freq: Two times a day (BID) | ORAL | 0 refills | Status: AC
  Filled 2024-05-21: qty 14, 7d supply, fill #0

## 2024-05-21 NOTE — Telephone Encounter (Signed)
 Sent in medication and left a voice mail on patient phone  per [facility]

## 2024-05-21 NOTE — Telephone Encounter (Signed)
 Pt called triage stating she was under the impression an antibiotic would be sent. She states ongoing urinary symptoms including foul urine odor, cloudy urine, dysuria, and lower abdominal pain. Please advise

## 2024-05-21 NOTE — Telephone Encounter (Signed)
 Send Rx Macrobid  100 mg twice daily x 7 days

## 2024-05-22 ENCOUNTER — Other Ambulatory Visit: Payer: Self-pay

## 2024-05-22 MED ORDER — OSELTAMIVIR PHOSPHATE 75 MG PO CAPS
75.0000 mg | ORAL_CAPSULE | Freq: Every day | ORAL | 0 refills | Status: AC
  Filled 2024-05-22: qty 10, 10d supply, fill #0

## 2024-05-23 NOTE — Telephone Encounter (Signed)
 Talked with patient today and she did pick up her medication and has started on it .

## 2024-05-28 ENCOUNTER — Other Ambulatory Visit: Payer: Self-pay

## 2024-05-28 ENCOUNTER — Other Ambulatory Visit (HOSPITAL_COMMUNITY): Payer: Self-pay

## 2024-05-28 MED ORDER — GLIMEPIRIDE 4 MG PO TABS
4.0000 mg | ORAL_TABLET | Freq: Two times a day (BID) | ORAL | 1 refills | Status: AC
  Filled 2024-05-28: qty 180, 90d supply, fill #0

## 2024-05-28 MED ORDER — LOSARTAN POTASSIUM 50 MG PO TABS
50.0000 mg | ORAL_TABLET | Freq: Every day | ORAL | 1 refills | Status: AC
  Filled 2024-05-28: qty 90, 90d supply, fill #0

## 2024-05-28 MED ORDER — OMEPRAZOLE 40 MG PO CPDR
40.0000 mg | DELAYED_RELEASE_CAPSULE | Freq: Two times a day (BID) | ORAL | 1 refills | Status: AC
  Filled 2024-05-28: qty 180, 90d supply, fill #0

## 2024-05-29 ENCOUNTER — Other Ambulatory Visit: Payer: Self-pay

## 2024-05-29 ENCOUNTER — Other Ambulatory Visit (HOSPITAL_COMMUNITY): Payer: Self-pay

## 2024-05-30 ENCOUNTER — Other Ambulatory Visit: Payer: Self-pay
